# Patient Record
Sex: Female | Born: 1935 | Race: White | Hispanic: No | Smoking: Former smoker
Health system: Southern US, Community
[De-identification: ages and names within clinical notes are randomized; demographics above are authoritative.]

## PROBLEM LIST (undated history)

## (undated) DIAGNOSIS — K219 Gastro-esophageal reflux disease without esophagitis: Secondary | ICD-10-CM

## (undated) DIAGNOSIS — M1990 Unspecified osteoarthritis, unspecified site: Secondary | ICD-10-CM

## (undated) DIAGNOSIS — J189 Pneumonia, unspecified organism: Secondary | ICD-10-CM

## (undated) DIAGNOSIS — J439 Emphysema, unspecified: Secondary | ICD-10-CM

## (undated) DIAGNOSIS — J449 Chronic obstructive pulmonary disease, unspecified: Secondary | ICD-10-CM

## (undated) HISTORY — PX: BREAST SURGERY: SHX581

## (undated) HISTORY — PX: ABDOMINAL HYSTERECTOMY: SHX81

## (undated) HISTORY — PX: BACK SURGERY: SHX140

---

## 2002-06-10 ENCOUNTER — Encounter: Admission: RE | Admit: 2002-06-10 | Discharge: 2002-06-10 | Payer: Self-pay | Admitting: Family Medicine

## 2002-06-10 ENCOUNTER — Encounter: Payer: Self-pay | Admitting: Family Medicine

## 2009-04-05 ENCOUNTER — Inpatient Hospital Stay (HOSPITAL_COMMUNITY): Admission: EM | Admit: 2009-04-05 | Discharge: 2009-04-10 | Payer: Self-pay | Admitting: Emergency Medicine

## 2010-03-11 ENCOUNTER — Encounter: Payer: Self-pay | Admitting: Family Medicine

## 2010-05-10 LAB — COMPREHENSIVE METABOLIC PANEL
CO2: 35 mEq/L — ABNORMAL HIGH (ref 19–32)
Calcium: 8.6 mg/dL (ref 8.4–10.5)
Chloride: 93 mEq/L — ABNORMAL LOW (ref 96–112)
Creatinine, Ser: 0.78 mg/dL (ref 0.4–1.2)
GFR calc non Af Amer: >60 mL/min (ref >60)
Glucose, Bld: 146 mg/dL — ABNORMAL HIGH (ref 70–99)
Total Bilirubin: 0.9 mg/dL (ref 0.3–1.2)

## 2010-05-10 LAB — BASIC METABOLIC PANEL
BUN: 11 mg/dL (ref 6–23)
BUN: 19 mg/dL (ref 6–23)
BUN: 20 mg/dL (ref 6–23)
CO2: 36 mEq/L — ABNORMAL HIGH (ref 19–32)
CO2: 40 mEq/L — ABNORMAL HIGH (ref 19–32)
CO2: 44 mEq/L (ref 19–32)
Calcium: 9 mg/dL (ref 8.4–10.5)
Calcium: 9 mg/dL (ref 8.4–10.5)
Calcium: 9.1 mg/dL (ref 8.4–10.5)
Calcium: 9.2 mg/dL (ref 8.4–10.5)
Chloride: 90 mEq/L — ABNORMAL LOW (ref 96–112)
Chloride: 92 mEq/L — ABNORMAL LOW (ref 96–112)
Creatinine, Ser: 0.7 mg/dL (ref 0.4–1.2)
Creatinine, Ser: 0.73 mg/dL (ref 0.4–1.2)
Creatinine, Ser: 0.73 mg/dL (ref 0.4–1.2)
GFR calc Af Amer: >60 mL/min (ref >60)
GFR calc Af Amer: >60 mL/min (ref >60)
GFR calc Af Amer: >60 mL/min (ref >60)
GFR calc non Af Amer: >60 mL/min (ref >60)
GFR calc non Af Amer: >60 mL/min (ref >60)
Glucose, Bld: 124 mg/dL — ABNORMAL HIGH (ref 70–99)
Glucose, Bld: 140 mg/dL — ABNORMAL HIGH (ref 70–99)
Glucose, Bld: 96 mg/dL (ref 70–99)
Potassium: 3.9 mEq/L (ref 3.5–5.1)
Potassium: 4.1 mEq/L (ref 3.5–5.1)
Sodium: 140 mEq/L (ref 135–145)
Sodium: 141 mEq/L (ref 135–145)
Sodium: 141 mEq/L (ref 135–145)
Sodium: 141 mEq/L (ref 135–145)

## 2010-05-10 LAB — CBC
HCT: 33.1 % — ABNORMAL LOW (ref 36.0–46.0)
HCT: 33.5 % — ABNORMAL LOW (ref 36.0–46.0)
HCT: 35.6 % — ABNORMAL LOW (ref 36.0–46.0)
Hemoglobin: 11.1 g/dL — ABNORMAL LOW (ref 12.0–15.0)
Hemoglobin: 11.3 g/dL — ABNORMAL LOW (ref 12.0–15.0)
Hemoglobin: 11.7 g/dL — ABNORMAL LOW (ref 12.0–15.0)
Hemoglobin: 11.9 g/dL — ABNORMAL LOW (ref 12.0–15.0)
MCHC: 33.1 g/dL (ref 30.0–36.0)
MCHC: 33.5 g/dL (ref 30.0–36.0)
MCHC: 33.7 g/dL (ref 30.0–36.0)
MCHC: 33.8 g/dL (ref 30.0–36.0)
MCV: 89 fL (ref 78.0–100.0)
MCV: 89.2 fL (ref 78.0–100.0)
MCV: 89.7 fL (ref 78.0–100.0)
Platelets: 239 10*3/uL (ref 150–400)
RBC: 3.71 MIL/uL — ABNORMAL LOW (ref 3.87–5.11)
RBC: 3.76 MIL/uL — ABNORMAL LOW (ref 3.87–5.11)
RBC: 3.91 MIL/uL (ref 3.87–5.11)
RBC: 4.16 MIL/uL (ref 3.87–5.11)
RDW: 13.7 % (ref 11.5–15.5)
RDW: 14 % (ref 11.5–15.5)
RDW: 14 % (ref 11.5–15.5)
WBC: 12 10*3/uL — ABNORMAL HIGH (ref 4.0–10.5)
WBC: 9.9 10*3/uL (ref 4.0–10.5)

## 2010-05-10 LAB — DIFFERENTIAL
Basophils Absolute: 0 10*3/uL (ref 0.0–0.1)
Basophils Absolute: 0 10*3/uL (ref 0.0–0.1)
Basophils Relative: 0 % (ref 0–1)
Eosinophils Absolute: 0 10*3/uL (ref 0.0–0.7)
Eosinophils Relative: 0 % (ref 0–5)
Lymphocytes Relative: 3 % — ABNORMAL LOW (ref 12–46)
Lymphs Abs: 0.3 10*3/uL — ABNORMAL LOW (ref 0.7–4.0)
Monocytes Relative: 6 % (ref 3–12)
Neutro Abs: 10.4 10*3/uL — ABNORMAL HIGH (ref 1.7–7.7)
Neutrophils Relative %: 84 % — ABNORMAL HIGH (ref 43–77)
Neutrophils Relative %: 96 % — ABNORMAL HIGH (ref 43–77)

## 2010-05-10 LAB — BLOOD GAS, ARTERIAL
Acid-Base Excess: 15.2 mmol/L — ABNORMAL HIGH (ref 0.0–2.0)
Bicarbonate: 43.3 mEq/L — ABNORMAL HIGH (ref 20.0–24.0)
O2 Content: 2 L/min
O2 Saturation: 92.5 %
O2 Saturation: 95 %
Patient temperature: 98.6
TCO2: 39.8 mmol/L (ref 0–100)
pO2, Arterial: 65.4 mmHg — ABNORMAL LOW (ref 80.0–100.0)
pO2, Arterial: 77.5 mmHg — ABNORMAL LOW (ref 80.0–100.0)

## 2010-05-10 LAB — CULTURE, BLOOD (ROUTINE X 2): Culture: NO GROWTH

## 2011-09-03 ENCOUNTER — Encounter (HOSPITAL_COMMUNITY): Payer: Self-pay | Admitting: *Deleted

## 2011-09-03 ENCOUNTER — Emergency Department (HOSPITAL_COMMUNITY)
Admission: EM | Admit: 2011-09-03 | Discharge: 2011-09-03 | Disposition: A | Discharge status: Home / Self Care | Payer: Medicare Other | Attending: Emergency Medicine | Admitting: Emergency Medicine

## 2011-09-03 ENCOUNTER — Emergency Department (HOSPITAL_COMMUNITY): Payer: Medicare Other

## 2011-09-03 DIAGNOSIS — R059 Cough, unspecified: Secondary | ICD-10-CM | POA: Insufficient documentation

## 2011-09-03 DIAGNOSIS — Z79899 Other long term (current) drug therapy: Secondary | ICD-10-CM | POA: Insufficient documentation

## 2011-09-03 DIAGNOSIS — R0602 Shortness of breath: Secondary | ICD-10-CM | POA: Insufficient documentation

## 2011-09-03 DIAGNOSIS — R05 Cough: Secondary | ICD-10-CM | POA: Insufficient documentation

## 2011-09-03 DIAGNOSIS — R079 Chest pain, unspecified: Secondary | ICD-10-CM | POA: Insufficient documentation

## 2011-09-03 DIAGNOSIS — J441 Chronic obstructive pulmonary disease with (acute) exacerbation: Secondary | ICD-10-CM | POA: Insufficient documentation

## 2011-09-03 HISTORY — DX: Chronic obstructive pulmonary disease, unspecified: J44.9

## 2011-09-03 HISTORY — DX: Unspecified osteoarthritis, unspecified site: M19.90

## 2011-09-03 LAB — CBC WITH DIFFERENTIAL/PLATELET
Basophils Absolute: 0 10*3/uL (ref 0.0–0.1)
Basophils Relative: 0 % (ref 0–1)
Eosinophils Absolute: 0.1 10*3/uL (ref 0.0–0.7)
MCH: 27.5 pg (ref 26.0–34.0)
MCHC: 29.3 g/dL — ABNORMAL LOW (ref 30.0–36.0)
Neutro Abs: 5.6 10*3/uL (ref 1.7–7.7)
Neutrophils Relative %: 80 % — ABNORMAL HIGH (ref 43–77)
Platelets: 189 10*3/uL (ref 150–400)
RDW: 14.5 % (ref 11.5–15.5)

## 2011-09-03 LAB — BASIC METABOLIC PANEL
Chloride: 97 mEq/L (ref 96–112)
GFR calc Af Amer: >90 mL/min (ref >90)
GFR calc non Af Amer: 89 mL/min — ABNORMAL LOW (ref >90)
Potassium: 4.1 mEq/L (ref 3.5–5.1)

## 2011-09-03 MED ORDER — ALBUTEROL SULFATE (5 MG/ML) 0.5% IN NEBU
2.5000 mg | INHALATION_SOLUTION | Freq: Once | RESPIRATORY_TRACT | Status: AC
  Administered 2011-09-03: 2.5 mg via RESPIRATORY_TRACT
  Filled 2011-09-03: qty 0.5

## 2011-09-03 MED ORDER — PREDNISONE 20 MG PO TABS: 60.0000 mg | ORAL_TABLET | Freq: Every day | ORAL | Status: AC

## 2011-09-03 MED ORDER — IPRATROPIUM BROMIDE 0.02 % IN SOLN: 0.5000 mg | RESPIRATORY_TRACT | Status: DC

## 2011-09-03 MED ORDER — IPRATROPIUM BROMIDE 0.02 % IN SOLN
0.5000 mg | Freq: Once | RESPIRATORY_TRACT | Status: AC
  Administered 2011-09-03: 0.5 mg via RESPIRATORY_TRACT
  Filled 2011-09-03: qty 2.5

## 2011-09-03 MED ORDER — PREDNISONE 20 MG PO TABS
60.0000 mg | ORAL_TABLET | Freq: Once | ORAL | Status: AC
  Administered 2011-09-03: 60 mg via ORAL
  Filled 2011-09-03: qty 3

## 2011-09-03 MED ORDER — ALBUTEROL SULFATE (5 MG/ML) 0.5% IN NEBU: 2.5000 mg | INHALATION_SOLUTION | RESPIRATORY_TRACT | Status: DC

## 2011-09-03 MED ORDER — DOXYCYCLINE HYCLATE 100 MG PO CAPS: 100.0000 mg | ORAL_CAPSULE | Freq: Two times a day (BID) | ORAL | Status: AC

## 2011-09-03 MED ORDER — DOXYCYCLINE HYCLATE 100 MG PO TABS
100.0000 mg | ORAL_TABLET | Freq: Once | ORAL | Status: AC
  Administered 2011-09-03: 100 mg via ORAL
  Filled 2011-09-03: qty 1

## 2011-09-03 NOTE — ED Notes (Signed)
Per EMS- pt has had SOB for at least one week. Has become progressively worse. Cough with clear sputum. Pt has swollen ankles and "blue" hands that EMS states is normal for her. Pt reports congestion like when she has pneumonia.

## 2011-09-03 NOTE — ED Provider Notes (Signed)
I saw and evaluated the patient, reviewed the resident's note and I agree with the findings and plan.  Bakary Bramer, MD 09/03/11 1600 

## 2011-09-03 NOTE — ED Provider Notes (Signed)
History     CSN: [id number]  Arrival date & time 09/03/11  1224   First MD Initiated Contact with Patient 09/03/11 1227      Chief Complaint  Patient presents with  . Shortness of Breath    (Consider location/radiation/quality/duration/timing/severity/associated sxs/prior treatment) Patient is a 78 y.o. female presenting with shortness of breath. The history is provided by the patient and a relative.  Shortness of Breath  The current episode started 5 to 7 days ago. The onset was gradual. The problem occurs continuously. The problem has been gradually worsening. The problem is moderate. The symptoms are relieved by beta-agonist inhalers and rest. The symptoms are aggravated by activity. Associated symptoms include chest pain, cough and shortness of breath. Pertinent negatives include no chest pressure, no orthopnea, no fever, no rhinorrhea, no sore throat, no stridor and no wheezing. There was no intake of a foreign body. She has had intermittent steroid use. She has had prior hospitalizations. Her past medical history is significant for past wheezing. Past medical history comments: copd. She has been behaving normally. Urine output has been normal. There were no sick contacts. She has received no recent medical care.    Past Medical History  Diagnosis Date  . COPD (chronic obstructive pulmonary disease)   . Arthritis     Past Surgical History  Procedure Date  . Abdominal hysterectomy   . Breast surgery     left  . Back surgery     History reviewed. No pertinent family history.  History  Substance Use Topics  . Smoking status: Former Games developer  . Smokeless tobacco: Not on file  . Alcohol Use: No    OB History    Grav Para Term Preterm Abortions TAB SAB Ect Mult Living                  Review of Systems  Constitutional: Negative for fever, chills, diaphoresis and fatigue.  HENT: Negative for ear pain, congestion, sore throat, facial swelling, rhinorrhea, mouth sores,  trouble swallowing, neck pain and neck stiffness.   Eyes: Negative.   Respiratory: Positive for cough and shortness of breath. Negative for apnea, chest tightness, wheezing and stridor.   Cardiovascular: Positive for chest pain. Negative for palpitations, orthopnea and leg swelling.  Gastrointestinal: Negative for nausea, vomiting, abdominal pain, diarrhea and abdominal distention.  Genitourinary: Negative for hematuria, flank pain, vaginal discharge, difficulty urinating and menstrual problem.  Musculoskeletal: Negative for back pain and gait problem.  Skin: Negative for rash and wound.  Neurological: Negative for dizziness, tremors, seizures, syncope, facial asymmetry, numbness and headaches.  Psychiatric/Behavioral: Negative.   All other systems reviewed and are negative.    Allergies  Codeine  Home Medications   Current Outpatient Rx  Name Route Sig Dispense Refill  . ALBUTEROL SULFATE HFA 108 (90 BASE) MCG/ACT IN AERS Inhalation Inhale 2 puffs into the lungs every 6 (six) hours as needed.    . ALBUTEROL SULFATE (2.5 MG/3ML) 0.083% IN NEBU Nebulization Take 2.5 mg by nebulization every 6 (six) hours as needed. For shortness of breath    . ALPRAZOLAM 1 MG PO TABS Oral Take 1 mg by mouth 3 (three) times daily as needed.    . ASPIRIN 81 MG PO TABS Oral Take 81 mg by mouth daily.    Marland Kitchen BUTALBITAL-APAP-CAFFEINE 50-325-40 MG PO TABS Oral Take 1 tablet by mouth 2 (two) times daily as needed.    . FUROSEMIDE 20 MG PO TABS Oral Take 20 mg by mouth daily.    Marland Kitchen  ADULT MULTIVITAMIN W/MINERALS CH Oral Take 1 tablet by mouth daily.    Marland Kitchen POTASSIUM CHLORIDE CRYS ER 10 MEQ PO TBCR Oral Take 10 mEq by mouth daily.    Marland Kitchen PREDNISONE 1 MG PO TABS Oral Take 7 mg by mouth daily.    . STOOL SOFTENER & LAXATIVE PO Oral Take 1 capsule by mouth daily.      BP 114/63  Pulse 92  Temp 99 F (37.2 C) (Oral)  Resp 20  SpO2 98%  Physical Exam  Nursing note and vitals reviewed. Constitutional: She is  oriented to person, place, and time. She appears well-developed. No distress.  HENT:  Head: Normocephalic and atraumatic.  Right Ear: External ear normal.  Left Ear: External ear normal.  Nose: Nose normal.  Mouth/Throat: Oropharynx is clear and moist. No oropharyngeal exudate.  Eyes: Conjunctivae and EOM are normal. Pupils are equal, round, and reactive to light. Right eye exhibits no discharge. Left eye exhibits no discharge.  Neck: Normal range of motion. Neck supple. No JVD present. No tracheal deviation present. No thyromegaly present.  Cardiovascular: Normal rate, regular rhythm, normal heart sounds and intact distal pulses.  Exam reveals no gallop and no friction rub.   No murmur heard. Pulmonary/Chest: Tachypnea noted. No respiratory distress. She has decreased breath sounds (diffusely throughout). She has no wheezes. She has no rales. She exhibits no tenderness.  Abdominal: Soft. Bowel sounds are normal. She exhibits no distension. There is no tenderness. There is no rebound and no guarding.  Musculoskeletal: Normal range of motion.  Lymphadenopathy:    She has no cervical adenopathy.  Neurological: She is alert and oriented to person, place, and time. No cranial nerve deficit. Coordination normal.  Skin: Skin is warm. No rash noted. She is not diaphoretic.  Psychiatric: She has a normal mood and affect. Her behavior is normal. Judgment and thought content normal.    ED Course  Procedures (including critical care time)   Labs Reviewed  CBC WITH DIFFERENTIAL  BASIC METABOLIC PANEL   No results found.   No diagnosis found.    MDM  78 year old female patient with past medical history of COPD who currently takes albuterol and is on 4 L of oxygen at home for her disease presents with worsening shortness of breath and cough for one week. Symptoms progressively worsening. Patient says cough is productive of yellowish sputum and increase in quantity. Patient says that she  started developing chest discomfort with coughing and breathing and this is the chest atypical for ACS. Patient with decreased bilateral breath sounds. Patient says her albuterol at home does help but is not improving as much as she would like. Differential diagnosis includes COPD exacerbation versus possible pneumonia.  Results for orders placed during the hospital encounter of 09/03/11  CBC WITH DIFFERENTIAL      Component Value Range   WBC 7.1  4.0 - 10.5 K/uL   RBC 3.86 (*) 3.87 - 5.11 MIL/uL   Hemoglobin 10.6 (*) 12.0 - 15.0 g/dL   HCT 16.1  09.6 - 04.5 %   MCV 93.8  78.0 - 100.0 fL   MCH 27.5  26.0 - 34.0 pg   MCHC 29.3 (*) 30.0 - 36.0 g/dL   RDW 40.9  81.1 - 91.4 %   Platelets 189  150 - 400 K/uL   Neutrophils Relative 80 (*) 43 - 77 %   Neutro Abs 5.6  1.7 - 7.7 K/uL   Lymphocytes Relative 13  12 - 46 %  Lymphs Abs 0.9  0.7 - 4.0 K/uL   Monocytes Relative 6  3 - 12 %   Monocytes Absolute 0.4  0.1 - 1.0 K/uL   Eosinophils Relative 2  0 - 5 %   Eosinophils Absolute 0.1  0.0 - 0.7 K/uL   Basophils Relative 0  0 - 1 %   Basophils Absolute 0.0  0.0 - 0.1 K/uL  BASIC METABOLIC PANEL      Component Value Range   Sodium 141  135 - 145 mEq/L   Potassium 4.1  3.5 - 5.1 mEq/L   Chloride 97  96 - 112 mEq/L   CO2 38 (*) 19 - 32 mEq/L   Glucose, Bld 94  70 - 99 mg/dL   BUN 19  6 - 23 mg/dL   Creatinine, Ser 1.61  0.50 - 1.10 mg/dL   Calcium 9.3  8.4 - 09.6 mg/dL   GFR calc non Af Amer 89 (*) >90 mL/min   GFR calc Af Amer >90  >90 mL/min   DG Chest 2 View (Final result)   Result time:09/03/11 1414    Final result by Rad Results In Interface (09/03/11 14:14:06)    Narrative:   *RADIOLOGY REPORT*  Clinical Data: Shortness of breath, wheezing.  CHEST - 2 VIEW  Comparison: 12/31/2010  Findings: There is hyperinflation of the lungs compatible with COPD. Mild cardiomegaly. Chronic interstitial lung disease throughout the lungs. Small effusion noted on the lateral view, likely  on the left. Chronic changes in the apices, likely scarring.  IMPRESSION: COPD/chronic changes. Cardiomegaly. The small left effusion on the lateral view, likely on the left.  Original Report Authenticated By: Cyndie Chime, M.D.    Date: 09/03/2011  Rate: 91  Rhythm: normal sinus rhythm  QRS Axis: normal  Intervals: normal  ST/T Wave abnormalities: normal  Conduction Disutrbances: none  Narrative Interpretation:   Old EKG Reviewed: No significant changes noted   Patient's symptoms improved with DuoNeb treatment. No evidence of pneumonia on chest x-ray. Likely patient with COPD exacerbation. Patient satting 99% on 4 L which is what she wears at home. Patient able to speak in full sentences does not appear short of breath or tachypnea and feels that her shortness of breath is improved. Will treat patient with 7 days of doxycycline and a 5 day steroid boost. Outpatient followup with PCP and instructed her to return if her symptoms get worse.  Case discussed with Dr. Allison Quarry, MD 09/03/11 1556

## 2011-09-03 NOTE — ED Notes (Signed)
Pt states that the breathing treatment "helped some" but states that she still feels pressure in her chest.

## 2011-09-03 NOTE — ED Provider Notes (Signed)
I saw and evaluated the patient, reviewed the resident's note and I agree with the findings and plan. Pt has oxygen dependent copd.  Has had progressive sob, cough for a few days along with cp.  On exam tachypneic with decreased bs.  cxr no pneumonia.  Saturations nl on oxygen. Will release on abxs since + change in sputum.    Cheri Guppy, MD 09/03/11 [phone]

## 2011-09-03 NOTE — ED Notes (Signed)
Pt called RN into room to show a small amount of bright red blood on tissue that pt "coughed up". Pt also states that she forgot to tell MD that she has had left sided chest pain intermittently for 2 weeks. The last occurrence was yesterday. Pt describes pain as burning under left breast. MD notified.

## 2011-12-20 ENCOUNTER — Encounter (HOSPITAL_COMMUNITY): Payer: Self-pay | Admitting: *Deleted

## 2011-12-20 ENCOUNTER — Emergency Department (HOSPITAL_COMMUNITY)
Admission: EM | Admit: 2011-12-20 | Discharge: 2011-12-20 | Disposition: A | Discharge status: Home / Self Care | Payer: Medicare Other | Attending: Emergency Medicine | Admitting: Emergency Medicine

## 2011-12-20 DIAGNOSIS — M129 Arthropathy, unspecified: Secondary | ICD-10-CM | POA: Insufficient documentation

## 2011-12-20 DIAGNOSIS — J449 Chronic obstructive pulmonary disease, unspecified: Secondary | ICD-10-CM | POA: Insufficient documentation

## 2011-12-20 DIAGNOSIS — R040 Epistaxis: Secondary | ICD-10-CM

## 2011-12-20 DIAGNOSIS — J438 Other emphysema: Secondary | ICD-10-CM | POA: Insufficient documentation

## 2011-12-20 DIAGNOSIS — Z7982 Long term (current) use of aspirin: Secondary | ICD-10-CM | POA: Insufficient documentation

## 2011-12-20 DIAGNOSIS — J4489 Other specified chronic obstructive pulmonary disease: Secondary | ICD-10-CM | POA: Insufficient documentation

## 2011-12-20 DIAGNOSIS — Z87891 Personal history of nicotine dependence: Secondary | ICD-10-CM | POA: Insufficient documentation

## 2011-12-20 HISTORY — DX: Emphysema, unspecified: J43.9

## 2011-12-20 LAB — CBC WITH DIFFERENTIAL/PLATELET
Basophils Relative: 1 % (ref 0–1)
HCT: 33.5 % — ABNORMAL LOW (ref 36.0–46.0)
Hemoglobin: 10 g/dL — ABNORMAL LOW (ref 12.0–15.0)
Lymphs Abs: 1.5 10*3/uL (ref 0.7–4.0)
MCH: 27.6 pg (ref 26.0–34.0)
MCHC: 29.9 g/dL — ABNORMAL LOW (ref 30.0–36.0)
Monocytes Absolute: 0.6 10*3/uL (ref 0.1–1.0)
Monocytes Relative: 7 % (ref 3–12)
Neutro Abs: 6.1 10*3/uL (ref 1.7–7.7)

## 2011-12-20 MED ORDER — CEPHALEXIN 500 MG PO CAPS: 500.0000 mg | ORAL_CAPSULE | Freq: Two times a day (BID) | ORAL | Status: DC

## 2011-12-20 MED ORDER — TRAMADOL HCL 50 MG PO TABS: 50.0000 mg | ORAL_TABLET | Freq: Four times a day (QID) | ORAL | Status: DC | PRN

## 2011-12-20 NOTE — ED Notes (Signed)
Pt with nosebleed. Pt states it started yesterday around 1800. Nosebleed has not ceased.

## 2011-12-20 NOTE — ED Notes (Signed)
Hyacinth Meeker EDP gave this nurse verbal order to discharge pt when her CBC is resulted.

## 2011-12-20 NOTE — ED Provider Notes (Signed)
History     CSN: [id number]  Arrival date & time 12/20/11  2725   First MD Initiated Contact with Patient 12/20/11 [phone]      Chief Complaint  Patient presents with  . Epistaxis    (Consider location/radiation/quality/duration/timing/severity/associated sxs/prior treatment) HPI Comments: 78 year old female who takes aspirin and also has COPD but has a complaint of a nosebleed it started yesterday evening, has been persistent throughout the night, mild, persistent, nothing makes better or worse, the nosebleed persisted despite holding pressure over the anterior nostrils. She admits to having a mild amount of blood was dripping down her throat as well. She denies any fevers shortness of breath or cough. She admits to having chronic home oxygen secondary to her obstructive pulmonary disease as well as being a nose picker.  Patient is a 78 y.o. female presenting with nosebleeds. The history is provided by the patient and a relative.  Epistaxis     Past Medical History  Diagnosis Date  . COPD (chronic obstructive pulmonary disease)   . Arthritis   . Emphysema     Past Surgical History  Procedure Date  . Abdominal hysterectomy   . Breast surgery     left  . Back surgery     History reviewed. No pertinent family history.  History  Substance Use Topics  . Smoking status: Former Games developer  . Smokeless tobacco: Never Used  . Alcohol Use: No    OB History    Grav Para Term Preterm Abortions TAB SAB Ect Mult Living                  Review of Systems  HENT: Positive for nosebleeds.   Respiratory: Negative for cough.     Allergies  Codeine  Home Medications   Current Outpatient Rx  Name Route Sig Dispense Refill  . ALBUTEROL SULFATE HFA 108 (90 BASE) MCG/ACT IN AERS Inhalation Inhale 2 puffs into the lungs every 6 (six) hours as needed.    . ALBUTEROL SULFATE (2.5 MG/3ML) 0.083% IN NEBU Nebulization Take 2.5 mg by nebulization every 6 (six) hours as needed. For  shortness of breath    . ALPRAZOLAM 1 MG PO TABS Oral Take 1 mg by mouth 3 (three) times daily as needed.    . ASPIRIN 81 MG PO TABS Oral Take 81 mg by mouth daily.    Marland Kitchen BUTALBITAL-APAP-CAFFEINE 50-325-40 MG PO TABS Oral Take 1 tablet by mouth 2 (two) times daily as needed.    . CEPHALEXIN 500 MG PO CAPS Oral Take 1 capsule (500 mg total) by mouth 2 (two) times daily. 20 capsule 0  . FUROSEMIDE 20 MG PO TABS Oral Take 20 mg by mouth daily.    . ADULT MULTIVITAMIN W/MINERALS CH Oral Take 1 tablet by mouth daily.    Marland Kitchen POTASSIUM CHLORIDE CRYS ER 10 MEQ PO TBCR Oral Take 10 mEq by mouth daily.    Marland Kitchen PREDNISONE 1 MG PO TABS Oral Take 7 mg by mouth daily.    . STOOL SOFTENER & LAXATIVE PO Oral Take 1 capsule by mouth daily.    . TRAMADOL HCL 50 MG PO TABS Oral Take 1 tablet (50 mg total) by mouth every 6 (six) hours as needed for pain. 15 tablet 0    BP 148/70  Pulse 93  Temp 97.5 F (36.4 C) (Oral)  Resp 20  SpO2 100%  Physical Exam  Nursing note and vitals reviewed. Constitutional: She appears well-developed and well-nourished. No distress.  HENT:  Head: Normocephalic and atraumatic.       Small amount of blood in the posterior oropharynx, blood clot from the nasopharynx present, mucous membranes are moist. Turbinates appear normal on the left, there is a large septal defect between right and left nares, right knee are occluded with blood clot, no active bleeding.  Eyes: Conjunctivae normal are normal. No scleral icterus.  Pulmonary/Chest: Effort normal. She has wheezes (diffuse expiratory wheezing is mild).  Abdominal: Soft. There is no tenderness.  Musculoskeletal:       No peripheral edema  Neurological:       Awake, alert, follows commands, speech is clear  Skin: Skin is warm and dry. No rash noted. She is not diaphoretic.    ED Course  EPISTAXIS MANAGEMENT Date/Time: 12/20/2011 6:45 AM Performed by: Eber Hong D Authorized by: Eber Hong D Consent: Verbal consent  obtained. Risks and benefits: risks, benefits and alternatives were discussed Consent given by: patient Patient understanding: patient states understanding of the procedure being performed Patient consent: the patient's understanding of the procedure matches consent given Required items: required blood products, implants, devices, and special equipment available Patient identity confirmed: verbally with patient Time out: Immediately prior to procedure a "time out" was called to verify the correct patient, procedure, equipment, support staff and site/side marked as required. Patient sedated: no Treatment site: right anterior Repair method: suction and anterior pack Post-procedure assessment: bleeding stopped Treatment complexity: simple Patient tolerance: Patient tolerated the procedure well with no immediate complications.   (including critical care time)   Labs Reviewed  CBC WITH DIFFERENTIAL   No results found.   1. Epistaxis       MDM  The patient has chronic COPD, she is oxygen dependent and in fact on my exam when the oxygen is taken off she drops to below 90%. She feels much more comfortable and low flow oxygen. She has been stable, has had a small amount of anterior packing, CBC ordered, abx, pain meds, ENT f/u.  Bleeding has resolved, CBC drawn, patient has expressed her understanding,   Discharge Prescriptions include:   Ultram  Keflex       Vida Roller, MD 12/20/11 [phone]

## 2012-01-02 ENCOUNTER — Emergency Department (HOSPITAL_COMMUNITY): Payer: Medicare Other

## 2012-01-02 ENCOUNTER — Encounter (HOSPITAL_COMMUNITY): Payer: Self-pay | Admitting: Emergency Medicine

## 2012-01-02 ENCOUNTER — Observation Stay (HOSPITAL_COMMUNITY)
Admission: EM | Admit: 2012-01-02 | Discharge: 2012-01-04 | Disposition: A | Discharge status: Home / Self Care | Payer: Medicare Other | Attending: Internal Medicine | Admitting: Internal Medicine

## 2012-01-02 DIAGNOSIS — I878 Other specified disorders of veins: Secondary | ICD-10-CM

## 2012-01-02 DIAGNOSIS — D638 Anemia in other chronic diseases classified elsewhere: Secondary | ICD-10-CM | POA: Insufficient documentation

## 2012-01-02 DIAGNOSIS — J441 Chronic obstructive pulmonary disease with (acute) exacerbation: Secondary | ICD-10-CM

## 2012-01-02 DIAGNOSIS — Z7982 Long term (current) use of aspirin: Secondary | ICD-10-CM | POA: Insufficient documentation

## 2012-01-02 DIAGNOSIS — J209 Acute bronchitis, unspecified: Principal | ICD-10-CM

## 2012-01-02 DIAGNOSIS — J440 Chronic obstructive pulmonary disease with acute lower respiratory infection: Principal | ICD-10-CM | POA: Insufficient documentation

## 2012-01-02 DIAGNOSIS — R040 Epistaxis: Secondary | ICD-10-CM

## 2012-01-02 DIAGNOSIS — Z79899 Other long term (current) drug therapy: Secondary | ICD-10-CM | POA: Insufficient documentation

## 2012-01-02 DIAGNOSIS — I872 Venous insufficiency (chronic) (peripheral): Secondary | ICD-10-CM | POA: Insufficient documentation

## 2012-01-02 DIAGNOSIS — Z23 Encounter for immunization: Secondary | ICD-10-CM | POA: Insufficient documentation

## 2012-01-02 DIAGNOSIS — Z87891 Personal history of nicotine dependence: Secondary | ICD-10-CM | POA: Insufficient documentation

## 2012-01-02 LAB — COMPREHENSIVE METABOLIC PANEL
Albumin: 3.2 g/dL — ABNORMAL LOW (ref 3.5–5.2)
BUN: 20 mg/dL (ref 6–23)
Calcium: 9.1 mg/dL (ref 8.4–10.5)
Creatinine, Ser: 0.56 mg/dL (ref 0.50–1.10)
GFR calc Af Amer: >90 mL/min (ref >90)
Glucose, Bld: 163 mg/dL — ABNORMAL HIGH (ref 70–99)
Potassium: 4.2 mEq/L (ref 3.5–5.1)
Total Protein: 6.8 g/dL (ref 6.0–8.3)

## 2012-01-02 LAB — CBC WITH DIFFERENTIAL/PLATELET
Basophils Relative: 0 % (ref 0–1)
Eosinophils Absolute: 0 10*3/uL (ref 0.0–0.7)
Eosinophils Relative: 0 % (ref 0–5)
Hemoglobin: 9.4 g/dL — ABNORMAL LOW (ref 12.0–15.0)
MCH: 27 pg (ref 26.0–34.0)
MCHC: 28 g/dL — ABNORMAL LOW (ref 30.0–36.0)
MCV: 96.6 fL (ref 78.0–100.0)
Monocytes Absolute: 0.4 10*3/uL (ref 0.1–1.0)
Monocytes Relative: 5 % (ref 3–12)
Neutrophils Relative %: 83 % — ABNORMAL HIGH (ref 43–77)

## 2012-01-02 LAB — LACTIC ACID, PLASMA: Lactic Acid, Venous: 1.7 mmol/L (ref 0.5–2.2)

## 2012-01-02 MED ORDER — ALBUTEROL SULFATE (5 MG/ML) 0.5% IN NEBU
2.5000 mg | INHALATION_SOLUTION | Freq: Four times a day (QID) | RESPIRATORY_TRACT | Status: DC
  Administered 2012-01-02 – 2012-01-04 (×7): 2.5 mg via RESPIRATORY_TRACT
  Filled 2012-01-02 (×8): qty 0.5

## 2012-01-02 MED ORDER — ASPIRIN EC 81 MG PO TBEC
81.0000 mg | DELAYED_RELEASE_TABLET | Freq: Every day | ORAL | Status: DC
  Administered 2012-01-03 – 2012-01-04 (×2): 81 mg via ORAL
  Filled 2012-01-02 (×2): qty 1

## 2012-01-02 MED ORDER — ALBUTEROL SULFATE (5 MG/ML) 0.5% IN NEBU
2.5000 mg | INHALATION_SOLUTION | RESPIRATORY_TRACT | Status: DC | PRN
  Administered 2012-01-03: 2.5 mg via RESPIRATORY_TRACT
  Filled 2012-01-02: qty 0.5

## 2012-01-02 MED ORDER — POTASSIUM CHLORIDE CRYS ER 10 MEQ PO TBCR
10.0000 meq | EXTENDED_RELEASE_TABLET | Freq: Every day | ORAL | Status: DC
  Administered 2012-01-02 – 2012-01-04 (×3): 10 meq via ORAL
  Filled 2012-01-02 (×3): qty 1

## 2012-01-02 MED ORDER — METHYLPREDNISOLONE SODIUM SUCC 125 MG IJ SOLR
125.0000 mg | Freq: Once | INTRAMUSCULAR | Status: AC
  Administered 2012-01-02: 125 mg via INTRAVENOUS
  Filled 2012-01-02: qty 2

## 2012-01-02 MED ORDER — ALPRAZOLAM 1 MG PO TABS
1.0000 mg | ORAL_TABLET | Freq: Three times a day (TID) | ORAL | Status: DC | PRN
  Administered 2012-01-02 – 2012-01-04 (×5): 1 mg via ORAL
  Filled 2012-01-02 (×5): qty 1

## 2012-01-02 MED ORDER — FUROSEMIDE 20 MG PO TABS
20.0000 mg | ORAL_TABLET | Freq: Every day | ORAL | Status: DC
  Administered 2012-01-03 – 2012-01-04 (×2): 20 mg via ORAL
  Filled 2012-01-02 (×2): qty 1

## 2012-01-02 MED ORDER — IPRATROPIUM BROMIDE 0.02 % IN SOLN
0.5000 mg | RESPIRATORY_TRACT | Status: AC
  Administered 2012-01-02 (×3): 0.5 mg via RESPIRATORY_TRACT
  Filled 2012-01-02 (×2): qty 2.5

## 2012-01-02 MED ORDER — LEVOFLOXACIN IN D5W 500 MG/100ML IV SOLN
500.0000 mg | INTRAVENOUS | Status: DC
  Administered 2012-01-02 – 2012-01-03 (×2): 500 mg via INTRAVENOUS
  Filled 2012-01-02 (×3): qty 100

## 2012-01-02 MED ORDER — ALBUTEROL SULFATE (5 MG/ML) 0.5% IN NEBU
5.0000 mg | INHALATION_SOLUTION | RESPIRATORY_TRACT | Status: AC
  Administered 2012-01-02 (×3): 5 mg via RESPIRATORY_TRACT
  Filled 2012-01-02: qty 0.5
  Filled 2012-01-02: qty 1

## 2012-01-02 MED ORDER — METHYLPREDNISOLONE SODIUM SUCC 40 MG IJ SOLR
40.0000 mg | Freq: Four times a day (QID) | INTRAMUSCULAR | Status: DC
  Administered 2012-01-02 – 2012-01-03 (×4): 40 mg via INTRAVENOUS
  Filled 2012-01-02 (×7): qty 1

## 2012-01-02 MED ORDER — BUTALBITAL-APAP-CAFFEINE 50-325-40 MG PO TABS
1.0000 | ORAL_TABLET | Freq: Two times a day (BID) | ORAL | Status: DC | PRN
  Administered 2012-01-02 – 2012-01-03 (×2): 1 via ORAL
  Filled 2012-01-02 (×2): qty 1

## 2012-01-02 MED ORDER — SODIUM CHLORIDE 0.9 % IJ SOLN
3.0000 mL | Freq: Two times a day (BID) | INTRAMUSCULAR | Status: DC
  Administered 2012-01-04: 3 mL via INTRAVENOUS

## 2012-01-02 MED ORDER — IPRATROPIUM BROMIDE 0.02 % IN SOLN
0.5000 mg | Freq: Four times a day (QID) | RESPIRATORY_TRACT | Status: DC
  Administered 2012-01-02 – 2012-01-03 (×3): 0.5 mg via RESPIRATORY_TRACT
  Filled 2012-01-02 (×3): qty 2.5

## 2012-01-02 MED ORDER — PNEUMOCOCCAL VAC POLYVALENT 25 MCG/0.5ML IJ INJ
0.5000 mL | INJECTION | INTRAMUSCULAR | Status: AC
  Administered 2012-01-03: 0.5 mL via INTRAMUSCULAR
  Filled 2012-01-02 (×2): qty 0.5

## 2012-01-02 NOTE — ED Notes (Signed)
Pt c/o of SOB that has gotten worse since last admit about a week ago. Now to the point where she can't walk from being tired and weak. Has a cough that is non productive. EMS placed on 5 liters of O2 and sats stayed at 91%

## 2012-01-02 NOTE — H&P (Signed)
[facility] History and Physical  Dana Hess:[id number] DOB: 1933-04-01 DOA: 01/02/2012  Referring physician: ED PCP: [name], MD  Specialists: None  Chief Complaint: SOB  HPI: Dana Hess is a 78 y.o. female h/o severe COPD on 4L home O2 at baseline for past 14 years presents to ED with SOB, cough productive of yellow sputum.  Worsening pulm status onset over past couple of days, daughter initially worried that this is PNA as she just had hospitalization for this recently.  Had to increase O2 at home to 5L which helped some.  No fevers, using nebs more frequently.  SOB located in lungs.  Quality is wheezing.  Evaluation in the ED demonstrated no PNA on CXR but severe emphysema.  Her wheezing was improved in ED with steroids and respiratory treatment.  Hospitalist has been asked to admit patient for COPD exacerbation, acute on chronic bronchitis.  Review of Systems: 12 systems reviewed and negative.  Past Medical History  Diagnosis Date  . COPD (chronic obstructive pulmonary disease)   . Arthritis   . Emphysema    Past Surgical History  Procedure Date  . Abdominal hysterectomy   . Breast surgery     left  . Back surgery    Social History:  reports that she has quit smoking. She has never used smokeless tobacco. She reports that she does not drink alcohol or use illicit drugs.   Allergies  Allergen Reactions  . Codeine Nausea And Vomiting and Rash    No family history on file. Daughter is alive and healthy.  Prior to Admission medications   Medication Sig Start Date End Date Taking? Authorizing Provider  albuterol (PROVENTIL HFA;VENTOLIN HFA) 108 (90 BASE) MCG/ACT inhaler Inhale 2 puffs into the lungs every 6 (six) hours as needed. For shortness of breath.   Yes Historical Provider, MD  albuterol (PROVENTIL) (2.5 MG/3ML) 0.083% nebulizer solution Take 2.5 mg by nebulization every 6 (six) hours as needed. For shortness of breath   Yes Historical  Provider, MD  ALPRAZolam Prudy Feeler) 1 MG tablet Take 1 mg by mouth 3 (three) times daily as needed. For anxiety.   Yes Historical Provider, MD  aspirin EC 81 MG tablet Take 81 mg by mouth daily.   Yes Historical Provider, MD  butalbital-acetaminophen-caffeine (FIORICET, ESGIC) 50-325-40 MG per tablet Take 1 tablet by mouth 2 (two) times daily as needed. For headache.   Yes Historical Provider, MD  furosemide (LASIX) 20 MG tablet Take 20 mg by mouth daily.   Yes Historical Provider, MD  potassium chloride (K-DUR,KLOR-CON) 10 MEQ tablet Take 10 mEq by mouth daily.   Yes Historical Provider, MD  predniSONE (DELTASONE) 1 MG tablet Take 7 mg by mouth daily.   Yes Historical Provider, MD  Sennosides-Docusate Sodium (STOOL SOFTENER & LAXATIVE PO) Take 1 capsule by mouth daily.   Yes Historical Provider, MD  cephALEXin (KEFLEX) 500 MG capsule Take 1 capsule (500 mg total) by mouth 2 (two) times daily. 12/20/11   Vida Roller, MD   Physical Exam: Filed Vitals:   01/02/12 1809 01/02/12 1829 01/02/12 1930 01/02/12 2049  BP:  140/70 116/61 129/55  Pulse:  118  103  Temp:  98 F (36.7 C)    TempSrc:  Oral    Resp:  12 22 28   SpO2: 92%   97%    General:  NAD, resting comfortably in bed Eyes: PEERLA EOMI ENT: mucous membranes moist Neck: supple w/o JVD Cardiovascular: RRR w/o MRG Respiratory: CTA  B at the time of my exam, unable to auscultate any wheezes which presumably have improved after ED intervention. Abdomen: soft, nt, nd, bs+ Skin: no rash nor lesion Musculoskeletal: MAE, full ROM all 4 extremities Psychiatric: normal tone and affect Neurologic: AAOx3, grossly non-focal  Labs on Admission:  Basic Metabolic Panel:  Lab 01/02/12 8119  NA 141  K 4.2  CL 94*  CO2 37*  GLUCOSE 163*  BUN 20  CREATININE 0.56  CALCIUM 9.1  MG --  PHOS --   Liver Function Tests:  Lab 01/02/12 1910  AST 19  ALT 12  ALKPHOS 63  BILITOT 0.1*  PROT 6.8  ALBUMIN 3.2*   No results found for this  basename: LIPASE:5,AMYLASE:5 in the last 168 hours No results found for this basename: AMMONIA:5 in the last 168 hours CBC:  Lab 01/02/12 1910  WBC 7.4  NEUTROABS 6.1  HGB 9.4*  HCT 33.6*  MCV 96.6  PLT 202   Cardiac Enzymes: No results found for this basename: CKTOTAL:5,CKMB:5,CKMBINDEX:5,TROPONINI:5 in the last 168 hours  BNP (last 3 results) No results found for this basename: PROBNP:3 in the last 8760 hours CBG: No results found for this basename: GLUCAP:5 in the last 168 hours  Radiological Exams on Admission: Dg Chest 2 View  01/02/2012  *RADIOLOGY REPORT*  Clinical Data: Shortness of breath.  CHEST - 2 VIEW  Comparison: 09/03/2011  Findings: There is chronic cardiomegaly with chronic or recurrent pulmonary vascular congestion superimposed on severe emphysema.  No acute infiltrates or effusions.  Chronic accentuation of the thoracic kyphosis.  IMPRESSION:  1.  Severe emphysema. 2.  Chronic cardiomegaly with recurrent or persistent pulmonary vascular congestion.   Original Report Authenticated By: Francene Boyers, M.D.     EKG: Independently reviewed.  Assessment/Plan Principal Problem:  *COPD with exacerbation Active Problems:  Acute bronchitis with chronic obstructive pulmonary disease (COPD)  Venous stasis of lower extremity   1. COPD with exacerbation - Admitting to observation, continuing IV solumedrol started in ED but at 40mg  q6h.  Also putting patient on GOLD protocol, adding levaquin for exacerbation.  Breathing treatments, continuous pulse ox.  No evidence of PNA on CXR, no evidence of a previously suspected mass seen on CXR some years ago (patient wouldn't want anything done about it anyhow she and daughter state). 2. DVT prophylaxis - Episode of severe epistaxis last week and mild episode yesterday - because of the bleeding issues patient wishes not to be put on prophylactic heparin for DVT prevention, cannot use SCDs on this patient due to venous stasis skin  changes, very thin skin as well over BLE explained to patient daughter risk of DVT and they understand.  No consults called at this time.  Code Status: Full Code (must indicate code status--if unknown or must be presumed, indicate so) Family Communication: Spoke with daughter at bedside (indicate person spoken with, if applicable, with phone number if by telephone) Disposition Plan: Admit to obs (indicate anticipated LOS)  Time spent:  GARDNER, JARED M. [facility] Pager [phone]  If 7PM-7AM, please contact night-coverage [email or web address] Password [facility] 01/02/2012, 9:17 PM

## 2012-01-02 NOTE — ED Notes (Signed)
WGN:[id number]<HY> Expected date:01/02/12<BR> Expected time: 5:55 PM<BR> Means of arrival:Ambulance<BR> Comments:<BR> Elderly/SOB/hx ofCOPD

## 2012-01-02 NOTE — ED Provider Notes (Signed)
History     CSN: [id number]  Arrival date & time 01/02/12  1758   First MD Initiated Contact with Patient 01/02/12 1809      Chief Complaint  Patient presents with  . Shortness of Breath    (Consider location/radiation/quality/duration/timing/severity/associated sxs/prior treatment) The history is provided by the patient.  Dana Hess is a 78 y.o. female hx of COPD here with SOB, cough. Chronic cough that is getting worse. For the last few days, she is coughing but unable to get phlegm out. No fevers. Her daughter just got out of the hospital for pneumonia and thinks that mom has pneumonia. She is on 5L nasal cannula at home and has been using more of her albuterol nebs. She is on prednisone 7.5 mg daily.    Past Medical History  Diagnosis Date  . COPD (chronic obstructive pulmonary disease)   . Arthritis   . Emphysema     Past Surgical History  Procedure Date  . Abdominal hysterectomy   . Breast surgery     left  . Back surgery     No family history on file.  History  Substance Use Topics  . Smoking status: Former Games developer  . Smokeless tobacco: Never Used  . Alcohol Use: No    OB History    Grav Para Term Preterm Abortions TAB SAB Ect Mult Living                  Review of Systems  Respiratory: Positive for cough and shortness of breath.   All other systems reviewed and are negative.    Allergies  Codeine  Home Medications   Current Outpatient Rx  Name  Route  Sig  Dispense  Refill  . ALBUTEROL SULFATE HFA 108 (90 BASE) MCG/ACT IN AERS   Inhalation   Inhale 2 puffs into the lungs every 6 (six) hours as needed. For shortness of breath.         . ALBUTEROL SULFATE (2.5 MG/3ML) 0.083% IN NEBU   Nebulization   Take 2.5 mg by nebulization every 6 (six) hours as needed. For shortness of breath         . ALPRAZOLAM 1 MG PO TABS   Oral   Take 1 mg by mouth 3 (three) times daily as needed. For anxiety.         . ASPIRIN EC 81 MG PO TBEC  Oral   Take 81 mg by mouth daily.         Marland Kitchen BUTALBITAL-APAP-CAFFEINE 50-325-40 MG PO TABS   Oral   Take 1 tablet by mouth 2 (two) times daily as needed. For headache.         . FUROSEMIDE 20 MG PO TABS   Oral   Take 20 mg by mouth daily.         Marland Kitchen POTASSIUM CHLORIDE CRYS ER 10 MEQ PO TBCR   Oral   Take 10 mEq by mouth daily.         Marland Kitchen PREDNISONE 1 MG PO TABS   Oral   Take 7 mg by mouth daily.         . STOOL SOFTENER & LAXATIVE PO   Oral   Take 1 capsule by mouth daily.         . CEPHALEXIN 500 MG PO CAPS   Oral   Take 1 capsule (500 mg total) by mouth 2 (two) times daily.   20 capsule   0     BP  140/70  Pulse 118  Temp 98 F (36.7 C) (Oral)  Resp 12  SpO2 92%  Physical Exam  Vitals reviewed. Constitutional: She is oriented to person, place, and time.       Chronically ill, audibly wheezing   HENT:  Head: Normocephalic.  Mouth/Throat: Oropharynx is clear and moist.  Eyes: Conjunctivae normal are normal. Pupils are equal, round, and reactive to light.  Neck: Normal range of motion. Neck supple.  Cardiovascular: Normal rate, regular rhythm and normal heart sounds.   Pulmonary/Chest:       Increased effort, diffuse wheezing, no obvious retractions, + tachypneic.   Abdominal: Soft. Bowel sounds are normal. She exhibits no distension. There is no tenderness. There is no rebound.  Musculoskeletal: Normal range of motion.       1+ edema (chronic)   Neurological: She is alert and oriented to person, place, and time.  Skin: Skin is warm and dry.  Psychiatric: She has a normal mood and affect. Her behavior is normal. Judgment and thought content normal.    ED Course  Procedures (including critical care time)  Labs Reviewed  CBC WITH DIFFERENTIAL - Abnormal; Notable for the following:    RBC 3.48 (*)     Hemoglobin 9.4 (*)     HCT 33.6 (*)     MCHC 28.0 (*)     Neutrophils Relative 83 (*)     All other components within normal limits    COMPREHENSIVE METABOLIC PANEL - Abnormal; Notable for the following:    Chloride 94 (*)     CO2 37 (*)     Glucose, Bld 163 (*)     Albumin 3.2 (*)     Total Bilirubin 0.1 (*)     GFR calc non Af Amer 88 (*)     All other components within normal limits  LACTIC ACID, PLASMA   Dg Chest 2 View  01/02/2012  *RADIOLOGY REPORT*  Clinical Data: Shortness of breath.  CHEST - 2 VIEW  Comparison: 09/03/2011  Findings: There is chronic cardiomegaly with chronic or recurrent pulmonary vascular congestion superimposed on severe emphysema.  No acute infiltrates or effusions.  Chronic accentuation of the thoracic kyphosis.  IMPRESSION:  1.  Severe emphysema. 2.  Chronic cardiomegaly with recurrent or persistent pulmonary vascular congestion.   Original Report Authenticated By: Francene Boyers, M.D.      1. COPD exacerbation      Date: 01/02/2012  Rate: 111  Rhythm: sinus tachycardia  QRS Axis: normal  Intervals: normal  ST/T Wave abnormalities: nonspecific ST changes  Conduction Disutrbances:none  Narrative Interpretation: Poor baseline   Old EKG Reviewed: unchanged    MDM  Dana Hess is a 78 y.o. female here with SOB, cough, hypoxia. She likely has COPD exacerbation but will also consider pneumonia. Will get labs, CXR, give nebs, prednisone and reassess. Likely will need admission.   8:35 PM CXR showed no infiltrate. Labs unremarkable. She was given solumedrol and nebs and now feels better. Will admit for COPD exacerbation.         Richardean Canal, MD 01/02/12 2037

## 2012-01-03 DIAGNOSIS — J441 Chronic obstructive pulmonary disease with (acute) exacerbation: Secondary | ICD-10-CM

## 2012-01-03 DIAGNOSIS — R040 Epistaxis: Secondary | ICD-10-CM

## 2012-01-03 DIAGNOSIS — J440 Chronic obstructive pulmonary disease with acute lower respiratory infection: Secondary | ICD-10-CM

## 2012-01-03 LAB — BASIC METABOLIC PANEL
Calcium: 8.9 mg/dL (ref 8.4–10.5)
GFR calc Af Amer: >90 mL/min (ref >90)
GFR calc non Af Amer: >90 mL/min (ref >90)
Glucose, Bld: 147 mg/dL — ABNORMAL HIGH (ref 70–99)
Potassium: 4.3 mEq/L (ref 3.5–5.1)
Sodium: 140 mEq/L (ref 135–145)

## 2012-01-03 LAB — CBC
MCH: 26.7 pg (ref 26.0–34.0)
MCHC: 27.8 g/dL — ABNORMAL LOW (ref 30.0–36.0)
Platelets: 190 10*3/uL (ref 150–400)
RDW: 13.5 % (ref 11.5–15.5)

## 2012-01-03 MED ORDER — TIOTROPIUM BROMIDE MONOHYDRATE 18 MCG IN CAPS
18.0000 ug | ORAL_CAPSULE | Freq: Every day | RESPIRATORY_TRACT | Status: DC
  Administered 2012-01-04: 18 ug via RESPIRATORY_TRACT
  Filled 2012-01-03: qty 5

## 2012-01-03 MED ORDER — METHYLPREDNISOLONE SODIUM SUCC 40 MG IJ SOLR
40.0000 mg | Freq: Two times a day (BID) | INTRAMUSCULAR | Status: DC
  Administered 2012-01-04: 40 mg via INTRAVENOUS
  Filled 2012-01-03 (×3): qty 1

## 2012-01-03 MED ORDER — MAGNESIUM HYDROXIDE 400 MG/5ML PO SUSP
30.0000 mL | Freq: Once | ORAL | Status: AC
  Administered 2012-01-03: 30 mL via ORAL
  Filled 2012-01-03: qty 30

## 2012-01-03 MED ORDER — IPRATROPIUM BROMIDE 0.02 % IN SOLN
0.5000 mg | Freq: Four times a day (QID) | RESPIRATORY_TRACT | Status: DC | PRN
  Administered 2012-01-03: 0.5 mg via RESPIRATORY_TRACT
  Filled 2012-01-03: qty 2.5

## 2012-01-03 MED ORDER — BIOTENE DRY MOUTH MT LIQD
15.0000 mL | Freq: Two times a day (BID) | OROMUCOSAL | Status: DC
  Administered 2012-01-03 – 2012-01-04 (×3): 15 mL via OROMUCOSAL

## 2012-01-03 NOTE — Progress Notes (Signed)
Nutrition Brief Note  RD consulted for assessment of nutrition status and needs.  Body mass index is 23.59 kg/(m^2). Pt meets criteria for wnl based on current BMI.   Pt and daughter state that pt is eating per her usual and pt reports her appetite is per her usual.  Current diet order is Regular, patient is consuming >50% of meals at this time.  Pt states she cannot wear her dentures due to bone erosion.  When asked if this limits her intake and if she plans to get a new pair remade pt states, "I'm not going to worry about it, I eat fine without them." Labs and medications reviewed.   No nutrition interventions warranted at this time. If nutrition issues arise, please consult RD.   Dana Dys, MS RD LDN Clinical Inpatient Dietitian Pager: [phone] Weekend/After hours pager: [phone]

## 2012-01-03 NOTE — Evaluation (Signed)
Occupational Therapy Evaluation Patient Details Name: Dana Hess MRN: [id number] DOB: 03-04-1933 Today's Date: 01/03/2012 Time: 4098-1191 OT Time Calculation (min): 20 min  OT Assessment / Plan / Recommendation Clinical Impression  This 78 year old female was admitted for COPD exacerbation.  She wears 02 all the time and is on 4 liters currently.  Pt has decreased activity tolerance but daughter has been helping her with adls.  Do not anticipate she'll need follow up OT    OT Assessment  Patient needs continued OT Services    Follow Up Recommendations  No OT follow up    Barriers to Discharge      Equipment Recommendations  None recommended by OT;None recommended by PT    Recommendations for Other Services    Frequency  Min 2X/week    Precautions / Restrictions Precautions Precautions: Fall Precaution Comments: Monitor O2 sats.  Restrictions Weight Bearing Restrictions: No   Pertinent Vitals/Pain No pain but dyspnea present 2/4    ADL  Toilet Transfer: Simulated;Minimal assistance (min guard) Toilet Transfer Method: Sit to stand (took 3 steps) Acupuncturist:  (chair to bed. ) Equipment Used:  (pt held iv pole:  didn't want walker) Transfers/Ambulation Related to ADLs: pt had just gotten off 3:1 with daughter.   ADL Comments: Daughter states that she helps pt at home.  Pt has been able to get to tub and take a shower sitting on a seat.  At time of eval, pt had just gotten off commode with daughter's assistance.  She would not be able to get into tub at this time, but possibly could do it after she rested.  Daughter has been helping with LB adls and pt is near baseline for this (mod A).  Educated on energy conservation techniques.      OT Diagnosis: Generalized weakness  OT Problem List: Decreased strength;Decreased activity tolerance;Cardiopulmonary status limiting activity OT Treatment Interventions: Self-care/ADL training;DME and/or AE  instruction;Patient/family education;Energy conservation   OT Goals Acute Rehab OT Goals OT Goal Formulation: With patient Time For Goal Achievement: 01/17/12 Potential to Achieve Goals: Good ADL Goals Pt Will Transfer to Toilet: with supervision;Ambulation;3-in-1 ADL Goal: Toilet Transfer - Progress: Goal set today Pt Will Perform Tub/Shower Transfer: Tub transfer;Ambulation;Shower seat with back;with min assist (min guard) ADL Goal: Tub/Shower Transfer - Progress: Goal set today Miscellaneous OT Goals Miscellaneous OT Goal #1: Pt will initiate rest breaks and pursed lip breathing as needed OT Goal: Miscellaneous Goal #1 - Progress: Goal set today  Visit Information  Last OT Received On: 01/03/12 Assistance Needed: +1    Subjective Data  Subjective: I just want to get back to bed Patient Stated Goal: home   Prior Functioning     Home Living Lives With: Daughter Available Help at Discharge: Family;Available 24 hours/day Type of Home: House Home Access: Stairs to enter [employer] of Steps: 1 Entrance Stairs-Rails: None Home Layout: One level Bathroom Shower/Tub: Engineer, manufacturing systems: Standard Home Adaptive Equipment: Bedside commode/3-in-1;Walker - four wheeled;Other (comment) (seat for tub) Prior Function Level of Independence: Needs assistance (for LB adls) Able to Take Stairs?: Yes Driving: No Vocation: Retired Musician: HOH (difficult to understand)         Vision/Perception     Cognition  Overall Cognitive Status: Appears within functional limits for tasks assessed/performed Arousal/Alertness: Awake/alert Orientation Level: Appears intact for tasks assessed Behavior During Session: [facility] for tasks performed    Extremity/Trunk Assessment Right Upper Extremity Assessment RUE ROM/Strength/Tone: [facility] for tasks assessed  Left Upper Extremity Assessment LUE ROM/Strength/Tone: WFL for tasks assessed Trunk  Assessment Trunk Assessment: Kyphotic     Mobility Bed Mobility Sit to Supine: 5: Supervision Transfers Sit to Stand: 4: Min guard;With upper extremity assist;From chair/3-in-1       Shoulder Instructions     Exercise     Balance     End of Session OT - End of Session Activity Tolerance: Patient limited by fatigue Patient left: in bed;with call bell/phone within reach;with family/visitor present  GO Functional Assessment Tool Used: clinical judgment Functional Limitation: Self care Self Care Current Status (V5643): At least 40 percent but less than 60 percent impaired, limited or restricted Self Care Goal Status (P2951): At least 40 percent but less than 60 percent impaired, limited or restricted   [name] 01/03/2012, 1:33 PM [name], OTR/L [phone] 01/03/2012

## 2012-01-03 NOTE — Progress Notes (Signed)
CRITICAL VALUE ALERT  Critical value received:  CO2- 41  Date of notification:  6:10 AM   Time of notification:  01/03/2012   Critical value read back:yes  Nurse who received alert:  B.Clelia Croft  MD notified (1st page):  [place]  Time of first page:  6:17 AM   MD notified (2nd page):  Time of second page:  Responding MD:  [place]  Time MD responded:  6:40 AM

## 2012-01-03 NOTE — Progress Notes (Signed)
Report given to float RN to assume care of this pt.  Dana Hess. Clelia Croft, RN

## 2012-01-03 NOTE — Progress Notes (Signed)
COPD PROTOCOL Admitted with SOB/COPD; PCP is Dr Carlyon Shadow and Lung specialist is Dr Francina Ames; Lives at home with family members. Patient has home 02. Patient stated that she does not have any problem ambulating and is not interested in any HHC services or pulmonary rehab; she use a walker at home and does not smoke; All prescriptions are filled at [facility] and patient stated that she does not have any problems getting her medication and is very pleased with [facility]. CM will continue to follow for DCP; BChandler RN,BSN,MHA

## 2012-01-03 NOTE — Evaluation (Signed)
Physical Therapy Evaluation Patient Details Name: Dana Hess MRN: [id number] DOB: 18-Jun-1933 Today's Date: 01/03/2012 Time: 4098-1191 PT Time Calculation (min): 36 min  PT Assessment / Plan / Recommendation Clinical Impression  Pt presents with COPD exacerbation on chronic oxygen use at home.  Tolerated OOB and agreed to some ambulation in hallway.  Checked O2 sats prior to ambulation on 4 L in room with O2 sats at 76-86%.  Increased O2 to 6L while ambulating with O2 sats at 86% HR 101 following ambulation.  RN notified.  Educated pt on importance of pursed lip breathing with pt stating that "she can't."  Pt will benefit from skilled PT in acute venue to address deficits.  PT recommends HHPT for follow up at D/C to maximize pts safety and independence.      PT Assessment  Patient needs continued PT services    Follow Up Recommendations  Home health PT;Supervision for mobility/OOB    Does the patient have the potential to tolerate intense rehabilitation      Barriers to Discharge None      Equipment Recommendations  None recommended by PT    Recommendations for Other Services OT consult   Frequency Min 3X/week    Precautions / Restrictions Precautions Precautions: Fall Precaution Comments: Monitor O2 sats.  Restrictions Weight Bearing Restrictions: No   Pertinent Vitals/Pain No pain, see comments about O2 sats      Mobility  Bed Mobility Bed Mobility: Supine to Sit;Sitting - Scoot to Edge of Bed Supine to Sit: 4: Min guard;HOB elevated;With rails Sitting - Scoot to Edge of Bed: 5: Supervision Details for Bed Mobility Assistance: Min/guard for trunk when getting to EOB.  Requires cues for hand placement.  Pt/daughter states she had no trouble getting OOB PTA, however pt wanted to elevate HOB when getting up.  She does not have hospital bed at home.  Transfers Transfers: Sit to Stand;Stand to Sit Sit to Stand: 4: Min guard;With upper extremity assist;From bed Stand  to Sit: 4: Min guard;With upper extremity assist;With armrests;To chair/3-in-1 Details for Transfer Assistance: Min/guard for safety with max cues for hand placement and keeping RW with her all the way to seating surface to ensure safety.  Ambulation/Gait Ambulation/Gait Assistance: 4: Min guard Ambulation Distance (Feet): 75 Feet Assistive device: Rolling walker Ambulation/Gait Assistance Details: Cues for safety and to continue pursed lip breathing while ambulating.  See O2 sats in comments.  Gait Pattern: Step-through pattern;Decreased stride length;Trunk flexed Gait velocity: decreased Stairs: No Wheelchair Mobility Wheelchair Mobility: No    Shoulder Instructions     Exercises     PT Diagnosis: Difficulty walking;Generalized weakness  PT Problem List: Decreased strength;Decreased activity tolerance;Decreased balance;Decreased mobility;Decreased knowledge of use of DME;Decreased safety awareness;Decreased knowledge of precautions;Cardiopulmonary status limiting activity PT Treatment Interventions: DME instruction;Gait training;Stair training;Functional mobility training;Therapeutic activities;Therapeutic exercise;Balance training;Patient/family education   PT Goals Acute Rehab PT Goals PT Goal Formulation: With patient/family Time For Goal Achievement: 01/10/12 Potential to Achieve Goals: Good Pt will go Supine/Side to Sit: with supervision PT Goal: Supine/Side to Sit - Progress: Goal set today Pt will go Sit to Supine/Side: with supervision PT Goal: Sit to Supine/Side - Progress: Goal set today Pt will go Sit to Stand: with supervision PT Goal: Sit to Stand - Progress: Goal set today Pt will go Stand to Sit: with modified independence PT Goal: Stand to Sit - Progress: Goal set today Pt will Ambulate: 51 - 150 feet;with supervision;with least restrictive assistive device PT Goal: Ambulate - Progress:  Goal set today Pt will Go Up / Down Stairs: 1-2 stairs;with supervision;with  least restrictive assistive device PT Goal: Up/Down Stairs - Progress: Goal set today  Visit Information  Last PT Received On: 01/03/12 Assistance Needed: +1    Subjective Data  Subjective: This walker is not like the one I use at home.  Patient Stated Goal: to get back home.    Prior Functioning  Home Living Lives With: Daughter Available Help at Discharge: Family;Available 24 hours/day Type of Home: House Home Access: Stairs to enter [employer] of Steps: 1 Entrance Stairs-Rails: None Home Layout: One level Bathroom Shower/Tub: Engineer, manufacturing systems: Standard Home Adaptive Equipment: Bedside commode/3-in-1;Walker - four wheeled Prior Function Level of Independence: Independent with assistive device(s) Able to Take Stairs?: Yes Driving: No Vocation: Retired Musician: Clinical cytogeneticist  Overall Cognitive Status: Appears within functional limits for tasks assessed/performed Arousal/Alertness: Awake/alert Orientation Level: Appears intact for tasks assessed Behavior During Session: [facility] for tasks performed    Extremity/Trunk Assessment Right Lower Extremity Assessment RLE ROM/Strength/Tone: Deficits RLE ROM/Strength/Tone Deficits: Generalized weakness, grossly 3/5 per functional assessment.  RLE Sensation: WFL - Light Touch Left Lower Extremity Assessment LLE ROM/Strength/Tone: Deficits LLE ROM/Strength/Tone Deficits: Generalized weakness, grossly 3/5 per functional assessment.  LLE Sensation: WFL - Light Touch Trunk Assessment Trunk Assessment: Kyphotic   Balance    End of Session PT - End of Session Equipment Utilized During Treatment: Oxygen Activity Tolerance: Patient limited by fatigue Patient left: in chair;with call bell/phone within reach Nurse Communication: Mobility status;Other (comment) (O2 sats)  GP Functional Assessment Tool Used: Clinical judgement.  Functional Limitation: Mobility: Walking and moving  around Mobility: Walking and Moving Around Current Status [phone]): At least 20 percent but less than 40 percent impaired, limited or restricted Mobility: Walking and Moving Around Goal Status [phone]): At least 1 percent but less than 20 percent impaired, limited or restricted   Lessie Dings 01/03/2012, 10:27 AM

## 2012-01-03 NOTE — Progress Notes (Signed)
Patient ID: Dana Hess, female   DOB: [birth year], 78 y.o.   MRN: [id number]  [facility] PROGRESS NOTE  Dana Hess WUJ:[id number] DOB: August 19, 1933 DOA: 01/02/2012 PCP: [name], MD  Brief narrative: Pt is 78 y.o. Female with h/o severe COPD on 4L home O2 at baseline for past 14 years who presented to ED with SOB, cough productive of yellow sputum. Worsening pulm status onset over past couple of days, daughter initially worried that this is PNA. Had to increase O2 at home to 5L which helped some. No fevers, using nebs more frequently.  Evaluation in the ED demonstrated no PNA on CXR but severe emphysema. Her wheezing was improved in ED with steroids and respiratory treatment. Hospitalist has been asked to admit patient for COPD exacerbation, acute on chronic bronchitis.  Principal Problem:  *COPD with exacerbation - clinically improved since admission - minimal wheezing on exam this AM - will continue antibiotic Levaquin, continue nebulizers as needed and scheduled, oxygen 4 L, solumedrol - plan on tapering down steroids in AM Active Problems:  Acute bronchitis with chronic obstructive pulmonary disease (COPD) - please see principal problem - continue antibiotics as noted above  Venous stasis of lower extremity - chronic and pt explains it is at her baseline Anemia of chronic disease - Hg and Hct are stable and at pt's baseline - CBC in AM  Consultants:  None  Procedures/Studies: Dg Chest 2 View 01/02/2012    IMPRESSION:   1.  Severe emphysema.  2.  Chronic cardiomegaly with recurrent or persistent pulmonary vascular congestion.     Antibiotics:  Levaquin 11/14 -->  Code Status: Full Family Communication: Pt at bedside Disposition Plan: Home when medically stable  HPI/Subjective: No events overnight.   Objective: Filed Vitals:   01/03/12 0458 01/03/12 0840 01/03/12 1141 01/03/12 1435  BP: 133/55   128/71  Pulse: 90   99  Temp: 98.4 F (36.9  C)   98.7 F (37.1 C)  TempSrc: Oral   Oral  Resp:    22  Height:      Weight:      SpO2: 94% 92% 92% 97%    Intake/Output Summary (Last 24 hours) at 01/03/12 1442 Last data filed at 01/03/12 0900  Gross per 24 hour  Intake    120 ml  Output      0 ml  Net    120 ml    Exam:   General:  Pt is alert, follows commands appropriately, not in acute distress  Cardiovascular: Regular rate and rhythm, S1/S2, no murmurs, no rubs, no gallops  Respiratory: Clear to auscultation bilaterally, mild end expiratory wheezing  Abdomen: Soft, non tender, non distended, bowel sounds present, no guarding  Extremities: No edema, pulses DP and PT palpable bilaterally, bilateral chronic venous stasis changes of the lower extremities   Neuro: Grossly nonfocal  Data Reviewed: Basic Metabolic Panel:  Lab 01/03/12 9562 01/02/12 1910  NA 140 141  K 4.3 4.2  CL 95* 94*  CO2 41* 37*  GLUCOSE 147* 163*  BUN 21 20  CREATININE 0.51 0.56  CALCIUM 8.9 9.1  MG -- --  PHOS -- --   Liver Function Tests:  Lab 01/02/12 1910  AST 19  ALT 12  ALKPHOS 63  BILITOT 0.1*  PROT 6.8  ALBUMIN 3.2*   CBC:  Lab 01/03/12 0435 01/02/12 1910  WBC 5.5 7.4  NEUTROABS -- 6.1  HGB 8.7* 9.4*  HCT 31.3* 33.6*  MCV 96.0 96.6  PLT  190 202   Scheduled Meds:   . albuterol  2.5 mg Nebulization Q6H  . [COMPLETED] albuterol  5 mg Nebulization Q30 min  . aspirin EC  81 mg Oral Daily  . furosemide  20 mg Oral Daily  . [COMPLETED] ipratropium  0.5 mg Nebulization Q30 min  . levofloxacin (LEVAQUIN) IV  500 mg Intravenous Q24H  . SOLU-MEDROL injection  40 mg Intravenous Q6H  . potassium chloride  10 mEq Oral Daily  . sodium chloride  3 mL Intravenous Q12H  . tiotropium  18 mcg Inhalation Daily   Continuous Infusions:    Debbora Presto, MD  [facility] Pager [phone]  If 7PM-7AM, please contact night-coverage [email or web address] Password [email or web address] 01/03/2012, 2:42 PM   LOS: 1 day

## 2012-01-04 DIAGNOSIS — I872 Venous insufficiency (chronic) (peripheral): Secondary | ICD-10-CM

## 2012-01-04 LAB — CBC
HCT: 28.2 % — ABNORMAL LOW (ref 36.0–46.0)
Hemoglobin: 8.2 g/dL — ABNORMAL LOW (ref 12.0–15.0)
MCV: 92.8 fL (ref 78.0–100.0)
WBC: 8.1 10*3/uL (ref 4.0–10.5)

## 2012-01-04 LAB — BASIC METABOLIC PANEL
BUN: 31 mg/dL — ABNORMAL HIGH (ref 6–23)
Chloride: 96 mEq/L (ref 96–112)
Creatinine, Ser: 0.65 mg/dL (ref 0.50–1.10)
Glucose, Bld: 100 mg/dL — ABNORMAL HIGH (ref 70–99)
Potassium: 3.8 mEq/L (ref 3.5–5.1)

## 2012-01-04 MED ORDER — PREDNISONE 10 MG PO TABS: ORAL_TABLET | ORAL | Status: DC

## 2012-01-04 MED ORDER — GUAIFENESIN ER 600 MG PO TB12
600.0000 mg | ORAL_TABLET | Freq: Two times a day (BID) | ORAL | Status: DC
  Administered 2012-01-04: 600 mg via ORAL
  Filled 2012-01-04 (×2): qty 1

## 2012-01-04 MED ORDER — TIOTROPIUM BROMIDE MONOHYDRATE 18 MCG IN CAPS: 18.0000 ug | ORAL_CAPSULE | Freq: Every day | RESPIRATORY_TRACT | Status: DC

## 2012-01-04 MED ORDER — LEVOFLOXACIN 500 MG PO TABS
500.0000 mg | ORAL_TABLET | Freq: Every day | ORAL | Status: DC
  Administered 2012-01-04: 500 mg via ORAL
  Filled 2012-01-04: qty 1

## 2012-01-04 MED ORDER — LEVOFLOXACIN 500 MG PO TABS: 500.0000 mg | ORAL_TABLET | Freq: Every day | ORAL | Status: DC

## 2012-01-04 MED ORDER — GUAIFENESIN ER 600 MG PO TB12: 600.0000 mg | ORAL_TABLET | Freq: Two times a day (BID) | ORAL | Status: DC

## 2012-01-04 NOTE — Discharge Summary (Addendum)
Physician Discharge Summary  Dana Hess AVW:[id number] DOB: 11-08-1933 DOA: 01/02/2012  PCP: [name], MD  Admit date: 01/02/2012 Discharge date: 01/04/2012  Time spent: 40 minutes  Recommendations for Outpatient Follow-up:  1. Follow up with PMD.   Discharge Diagnoses:  Principal Problem:  *COPD with exacerbation Active Problems:  Acute bronchitis with chronic obstructive pulmonary disease (COPD)  Venous stasis of lower extremity  Epistaxis   Discharge Condition: Satisfactory.  Diet recommendation: Regular.   Filed Weights   01/02/12 2230  Weight: 54.8 kg (120 lb 13 oz)    History of present illness:  78 y.o. female with h/o severe COPD on 4L home O2 at baseline for past 14 years, presenting to ED with SOB, cough productive of yellow sputum and worsening respiratory status onset over past couple of Daughter initially worried that this is PNA as patient just had hospitalization for this recently, and had to increase O2 at home to 5L which helped some. No fevers, using nebs more frequently. Evaluation in the ED demonstrated no PNA on CXR but severe emphysema. Wheezing was improved in ED with steroids and respiratory treatment, and patient was admitted for further management.    Hospital Course:  1. COPD with exacerbation:  -Patient presented with worsening SOB/Wheeze, as well as a cough, productive of yellow phlegm. Wcc was normal, she was apyrexial, and CXR was devoid of pneumonic consolidation. Clearly, she has an infective exacerbation of advanced oxygen-dependent COPD, secondary to acute bronchitis. Managed with Levaquin, nebulizers, oxygen supplementation and steroids, with satisfactory response. As of 01/04/12, patient felt much better, and was keen to be discharged. She was transitioned to oral steroid taper, as well as oral antibiotics on that date.  2. Acute bronchitis: This is the culprit for patient's acute presentation. See above discussion.  3. Venous  stasis of lower extremity:   Thi ss chronic and not problematic.  4. Anemia of chronic disease  Hb and Hct are stable and at patient's baseline.      Procedures:  See below.   Consultations:  N/A.   Discharge Exam: Filed Vitals:   01/03/12 2048 01/04/12 0215 01/04/12 0559 01/04/12 0911  BP: 141/66  101/46   Pulse: 111  93   Temp: 98.8 F (37.1 C)  98.7 F (37.1 C)   TempSrc: Oral  Oral   Resp: 20  18   Height:      Weight:      SpO2: 96% 95% 100% 92%    General: Comfortable, alert, communicative, fully oriented, not short of breath at rest. Only mildly productive cough.  HEENT: Mild clinical pallor, no jaundice, no conjunctival injection or discharge.  NECK: Supple, JVP not seen, no carotid bruits, no palpable lymphadenopathy, no palpable goiter.  CHEST: Clinically clear to auscultation, no wheezes, no crackles.  HEART: Sounds 1 and 2 heard, normal, regular, no murmurs.  ABDOMEN: Moderately obese, soft, non-tender, no palpable organomegaly, no palpable masses, normal bowel sounds.  GENITALIA: Not examined.  LOWER EXTREMITIES: Minimal pitting edema, palpable peripheral pulses.  MUSCULOSKELETAL SYSTEM: Generalized osteoarthritic changes, otherwise, normal.  CENTRAL NERVOUS SYSTEM: No focal neurologic deficit on gross examination.  Discharge Instructions      Discharge Orders    Future Orders Please Complete By Expires   Diet general      Increase activity slowly          Medication List     As of 01/04/2012  1:01 PM    STOP taking these medications  cephALEXin 500 MG capsule   Commonly known as: KEFLEX      TAKE these medications         albuterol 108 (90 BASE) MCG/ACT inhaler   Commonly known as: PROVENTIL HFA;VENTOLIN HFA   Inhale 2 puffs into the lungs every 6 (six) hours as needed. For shortness of breath.      albuterol (2.5 MG/3ML) 0.083% nebulizer solution   Commonly known as: PROVENTIL   Take 2.5 mg by nebulization every 6 (six)  hours as needed. For shortness of breath      ALPRAZolam 1 MG tablet   Commonly known as: XANAX   Take 1 mg by mouth 3 (three) times daily as needed. For anxiety.      aspirin EC 81 MG tablet   Take 81 mg by mouth daily.      butalbital-acetaminophen-caffeine 50-325-40 MG per tablet   Commonly known as: FIORICET, ESGIC   Take 1 tablet by mouth 2 (two) times daily as needed. For headache.      furosemide 20 MG tablet   Commonly known as: LASIX   Take 20 mg by mouth daily.      guaiFENesin 600 MG 12 hr tablet   Commonly known as: MUCINEX   Take 1 tablet (600 mg total) by mouth 2 (two) times daily.      levofloxacin 500 MG tablet   Commonly known as: LEVAQUIN   Take 1 tablet (500 mg total) by mouth daily.      potassium chloride 10 MEQ tablet   Commonly known as: K-DUR,KLOR-CON   Take 10 mEq by mouth daily.      predniSONE 10 MG tablet   Commonly known as: DELTASONE   Take 40 mg daily for 3 days, then 30 mg daily for 3 days, then 20 mg daily for 3 days, then 10 mg daily for 3 days, then stop.      STOOL SOFTENER & LAXATIVE PO   Take 1 capsule by mouth daily.      tiotropium 18 MCG inhalation capsule   Commonly known as: SPIRIVA   Place 1 capsule (18 mcg total) into inhaler and inhale daily.         Follow-up Information    Follow up with [name], MD. In 1 week.   Contact information:   [address] [place] [address] [phone]           The results of significant diagnostics from this hospitalization (including imaging, microbiology, ancillary and laboratory) are listed below for reference.    Significant Diagnostic Studies: Dg Chest 2 View  01/02/2012  *RADIOLOGY REPORT*  Clinical Data: Shortness of breath.  CHEST - 2 VIEW  Comparison: 09/03/2011  Findings: There is chronic cardiomegaly with chronic or recurrent pulmonary vascular congestion superimposed on severe emphysema.  No acute infiltrates or effusions.  Chronic accentuation of  the thoracic kyphosis.  IMPRESSION:  1.  Severe emphysema. 2.  Chronic cardiomegaly with recurrent or persistent pulmonary vascular congestion.   Original Report Authenticated By: Francene Boyers, M.D.     Microbiology: No results found for this or any previous visit (from the past 240 hour(s)).   Labs: Basic Metabolic Panel:  Lab 01/04/12 9147 01/03/12 0435 01/02/12 1910  NA 141 140 141  K 3.8 4.3 4.2  CL 96 95* 94*  CO2 43* 41* 37*  GLUCOSE 100* 147* 163*  BUN 31* 21 20  CREATININE 0.65 0.51 0.56  CALCIUM 9.3 8.9 9.1  MG -- -- --  PHOS -- -- --   Liver Function Tests:  Lab 01/02/12 1910  AST 19  ALT 12  ALKPHOS 63  BILITOT 0.1*  PROT 6.8  ALBUMIN 3.2*   No results found for this basename: LIPASE:5,AMYLASE:5 in the last 168 hours No results found for this basename: AMMONIA:5 in the last 168 hours CBC:  Lab 01/04/12 0454 01/03/12 0435 01/02/12 1910  WBC 8.1 5.5 7.4  NEUTROABS -- -- 6.1  HGB 8.2* 8.7* 9.4*  HCT 28.2* 31.3* 33.6*  MCV 92.8 96.0 96.6  PLT 191 190 202   Cardiac Enzymes: No results found for this basename: CKTOTAL:5,CKMB:5,CKMBINDEX:5,TROPONINI:5 in the last 168 hours BNP: BNP (last 3 results) No results found for this basename: PROBNP:3 in the last 8760 hours CBG: No results found for this basename: GLUCAP:5 in the last 168 hours     Signed:  Zalaya Astarita,CHRISTOPHER  [facility] 01/04/2012, 1:01 PM

## 2012-06-19 ENCOUNTER — Inpatient Hospital Stay (HOSPITAL_COMMUNITY)
Admission: EM | Admit: 2012-06-19 | Discharge: 2012-06-22 | DRG: 193 | Disposition: A | Discharge status: Home / Self Care | Payer: Medicare Other | Attending: Internal Medicine | Admitting: Internal Medicine

## 2012-06-19 ENCOUNTER — Emergency Department (HOSPITAL_COMMUNITY): Payer: Medicare Other

## 2012-06-19 ENCOUNTER — Encounter (HOSPITAL_COMMUNITY): Payer: Self-pay

## 2012-06-19 DIAGNOSIS — J209 Acute bronchitis, unspecified: Secondary | ICD-10-CM

## 2012-06-19 DIAGNOSIS — D638 Anemia in other chronic diseases classified elsewhere: Secondary | ICD-10-CM | POA: Diagnosis present

## 2012-06-19 DIAGNOSIS — J441 Chronic obstructive pulmonary disease with (acute) exacerbation: Secondary | ICD-10-CM

## 2012-06-19 DIAGNOSIS — Z66 Do not resuscitate: Secondary | ICD-10-CM | POA: Diagnosis present

## 2012-06-19 DIAGNOSIS — K219 Gastro-esophageal reflux disease without esophagitis: Secondary | ICD-10-CM | POA: Diagnosis present

## 2012-06-19 DIAGNOSIS — J189 Pneumonia, unspecified organism: Principal | ICD-10-CM

## 2012-06-19 DIAGNOSIS — I878 Other specified disorders of veins: Secondary | ICD-10-CM

## 2012-06-19 DIAGNOSIS — Z87891 Personal history of nicotine dependence: Secondary | ICD-10-CM

## 2012-06-19 DIAGNOSIS — J440 Chronic obstructive pulmonary disease with acute lower respiratory infection: Secondary | ICD-10-CM

## 2012-06-19 DIAGNOSIS — J9601 Acute respiratory failure with hypoxia: Secondary | ICD-10-CM | POA: Diagnosis present

## 2012-06-19 DIAGNOSIS — J9620 Acute and chronic respiratory failure, unspecified whether with hypoxia or hypercapnia: Secondary | ICD-10-CM

## 2012-06-19 DIAGNOSIS — R040 Epistaxis: Secondary | ICD-10-CM

## 2012-06-19 HISTORY — DX: Pneumonia, unspecified organism: J18.9

## 2012-06-19 HISTORY — DX: Gastro-esophageal reflux disease without esophagitis: K21.9

## 2012-06-19 LAB — BLOOD GAS, ARTERIAL
Acid-Base Excess: 15.6 mmol/L — ABNORMAL HIGH (ref 0.0–2.0)
Bicarbonate: 43.5 mEq/L — ABNORMAL HIGH (ref 20.0–24.0)
Patient temperature: 98.6
TCO2: 42.5 mmol/L (ref 0–100)
pH, Arterial: 7.311 — ABNORMAL LOW (ref 7.350–7.450)

## 2012-06-19 LAB — CBC
HCT: 25.5 % — ABNORMAL LOW (ref 36.0–46.0)
HCT: 28.1 % — ABNORMAL LOW (ref 36.0–46.0)
Hemoglobin: 6.9 g/dL — CL (ref 12.0–15.0)
MCH: 21.9 pg — ABNORMAL LOW (ref 26.0–34.0)
MCH: 20.8 pg — ABNORMAL LOW (ref 26.0–34.0)
MCHC: 25.9 g/dL — ABNORMAL LOW (ref 30.0–36.0)
MCV: 84.7 fL (ref 78.0–100.0)
MCV: 84.6 fL (ref 78.0–100.0)
RBC: 3.32 MIL/uL — ABNORMAL LOW (ref 3.87–5.11)
RDW: 17.2 % — ABNORMAL HIGH (ref 11.5–15.5)
WBC: 8.6 10*3/uL (ref 4.0–10.5)
WBC: 9.7 10*3/uL (ref 4.0–10.5)

## 2012-06-19 LAB — BASIC METABOLIC PANEL
BUN: 16 mg/dL (ref 6–23)
CO2: >45 mEq/L (ref 19–32)
Chloride: 94 mEq/L — ABNORMAL LOW (ref 96–112)
Creatinine, Ser: 0.45 mg/dL — ABNORMAL LOW (ref 0.50–1.10)
Glucose, Bld: 118 mg/dL — ABNORMAL HIGH (ref 70–99)

## 2012-06-19 LAB — MRSA PCR SCREENING: MRSA by PCR: NEGATIVE

## 2012-06-19 MED ORDER — GUAIFENESIN 100 MG/5ML PO SOLN
10.0000 mL | ORAL | Status: DC | PRN
Start: 2012-06-19 — End: 2012-06-20
  Filled 2012-06-19: qty 10

## 2012-06-19 MED ORDER — ENOXAPARIN SODIUM 30 MG/0.3ML ~~LOC~~ SOLN
30.0000 mg | SUBCUTANEOUS | Status: DC
Start: 2012-06-19 — End: 2012-06-19
  Filled 2012-06-19: qty 0.3

## 2012-06-19 MED ORDER — SODIUM CHLORIDE 0.9 % IJ SOLN
3.0000 mL | Freq: Two times a day (BID) | INTRAMUSCULAR | Status: DC
  Administered 2012-06-21 – 2012-06-22 (×3): 3 mL via INTRAVENOUS

## 2012-06-19 MED ORDER — BUTALBITAL-APAP-CAFFEINE 50-325-40 MG PO TABS
1.0000 | ORAL_TABLET | Freq: Once | ORAL | Status: AC | PRN
  Administered 2012-06-19: 1 via ORAL

## 2012-06-19 MED ORDER — POTASSIUM CHLORIDE CRYS ER 10 MEQ PO TBCR
10.0000 meq | EXTENDED_RELEASE_TABLET | Freq: Every day | ORAL | Status: DC
  Administered 2012-06-19 – 2012-06-22 (×4): 10 meq via ORAL
  Filled 2012-06-19 (×4): qty 1

## 2012-06-19 MED ORDER — SODIUM CHLORIDE 0.9 % IJ SOLN
3.0000 mL | INTRAMUSCULAR | Status: DC | PRN
  Administered 2012-06-21: 3 mL via INTRAVENOUS

## 2012-06-19 MED ORDER — ALBUTEROL (5 MG/ML) CONTINUOUS INHALATION SOLN
10.0000 mg/h | INHALATION_SOLUTION | Freq: Once | RESPIRATORY_TRACT | Status: AC
  Administered 2012-06-19: 10 mg/h via RESPIRATORY_TRACT
  Filled 2012-06-19: qty 20

## 2012-06-19 MED ORDER — SODIUM CHLORIDE 0.9 % IV SOLN: 250.0000 mL | INTRAVENOUS | Status: DC | PRN

## 2012-06-19 MED ORDER — ALBUTEROL SULFATE (5 MG/ML) 0.5% IN NEBU
2.5000 mg | INHALATION_SOLUTION | RESPIRATORY_TRACT | Status: DC | PRN
  Administered 2012-06-20 – 2012-06-22 (×2): 2.5 mg via RESPIRATORY_TRACT
  Filled 2012-06-19 (×2): qty 0.5

## 2012-06-19 MED ORDER — LORAZEPAM 2 MG/ML IJ SOLN
1.0000 mg | Freq: Once | INTRAMUSCULAR | Status: AC
  Administered 2012-06-19: 1 mg via INTRAVENOUS
  Filled 2012-06-19: qty 1

## 2012-06-19 MED ORDER — ALPRAZOLAM 0.25 MG PO TABS
0.2500 mg | ORAL_TABLET | Freq: Three times a day (TID) | ORAL | Status: DC | PRN
Start: 2012-06-19 — End: 2012-06-20
  Administered 2012-06-19 (×2): 0.25 mg via ORAL
  Filled 2012-06-19 (×3): qty 1

## 2012-06-19 MED ORDER — LEVOFLOXACIN IN D5W 500 MG/100ML IV SOLN
500.0000 mg | Freq: Once | INTRAVENOUS | Status: AC
  Administered 2012-06-19: 500 mg via INTRAVENOUS
  Filled 2012-06-19: qty 100

## 2012-06-19 MED ORDER — METHYLPREDNISOLONE SODIUM SUCC 125 MG IJ SOLR
125.0000 mg | Freq: Four times a day (QID) | INTRAMUSCULAR | Status: DC
  Administered 2012-06-19 – 2012-06-20 (×3): 125 mg via INTRAVENOUS
  Filled 2012-06-19 (×7): qty 2

## 2012-06-19 MED ORDER — PANTOPRAZOLE SODIUM 40 MG PO TBEC
40.0000 mg | DELAYED_RELEASE_TABLET | Freq: Every day | ORAL | Status: DC
  Administered 2012-06-19 – 2012-06-20 (×2): 40 mg via ORAL
  Filled 2012-06-19 (×2): qty 1

## 2012-06-19 MED ORDER — FUROSEMIDE 20 MG PO TABS
20.0000 mg | ORAL_TABLET | Freq: Every day | ORAL | Status: DC
  Administered 2012-06-20 – 2012-06-21 (×2): 20 mg via ORAL
  Filled 2012-06-19 (×4): qty 1

## 2012-06-19 MED ORDER — METHYLPREDNISOLONE SODIUM SUCC 125 MG IJ SOLR
125.0000 mg | Freq: Once | INTRAMUSCULAR | Status: AC
  Administered 2012-06-19: 125 mg via INTRAVENOUS
  Filled 2012-06-19: qty 2

## 2012-06-19 MED ORDER — ALBUTEROL SULFATE (5 MG/ML) 0.5% IN NEBU: 2.5000 mg | INHALATION_SOLUTION | RESPIRATORY_TRACT | Status: DC | PRN

## 2012-06-19 MED ORDER — GUAIFENESIN 100 MG/5ML PO SYRP
200.0000 mg | ORAL_SOLUTION | ORAL | Status: DC | PRN
  Filled 2012-06-19: qty 118

## 2012-06-19 MED ORDER — ALBUTEROL SULFATE (5 MG/ML) 0.5% IN NEBU
2.5000 mg | INHALATION_SOLUTION | Freq: Four times a day (QID) | RESPIRATORY_TRACT | Status: DC
  Administered 2012-06-19 – 2012-06-22 (×9): 2.5 mg via RESPIRATORY_TRACT
  Filled 2012-06-19 (×9): qty 0.5

## 2012-06-19 MED ORDER — SODIUM CHLORIDE 0.9 % IV SOLN
Freq: Once | INTRAVENOUS | Status: AC
  Administered 2012-06-19: 15:00:00 via INTRAVENOUS

## 2012-06-19 MED ORDER — VITAMINS A & D EX OINT
TOPICAL_OINTMENT | CUTANEOUS | Status: AC
  Administered 2012-06-19: 19:00:00
  Filled 2012-06-19: qty 5

## 2012-06-19 MED ORDER — ASPIRIN EC 81 MG PO TBEC
81.0000 mg | DELAYED_RELEASE_TABLET | Freq: Every day | ORAL | Status: DC
  Administered 2012-06-19 – 2012-06-21 (×3): 81 mg via ORAL
  Filled 2012-06-19 (×6): qty 1

## 2012-06-19 MED ORDER — LORAZEPAM 2 MG/ML IJ SOLN: 1.0000 mg | Freq: Three times a day (TID) | INTRAMUSCULAR | Status: DC | PRN

## 2012-06-19 MED ORDER — LEVOFLOXACIN IN D5W 750 MG/150ML IV SOLN
750.0000 mg | INTRAVENOUS | Status: DC
  Administered 2012-06-19: 750 mg via INTRAVENOUS
  Filled 2012-06-19 (×2): qty 150

## 2012-06-19 MED ORDER — ALBUTEROL SULFATE (5 MG/ML) 0.5% IN NEBU: 5.0000 mg | INHALATION_SOLUTION | Freq: Once | RESPIRATORY_TRACT | Status: DC

## 2012-06-19 NOTE — ED Notes (Signed)
MD at bedside. 

## 2012-06-19 NOTE — ED Notes (Signed)
Per EMS, Pt, from home, c/o increasing SOB x 2 days.  Hx of COPD and seasonal allergies.  Pt on non-rebreather 10L, in route, and O2 Sat 100%.  Vitals stable.  A & Ox4.

## 2012-06-19 NOTE — Plan of Care (Signed)
Problem: ICU Phase Progression Outcomes Goal: O2 sats trending toward baseline Outcome: Not Progressing Unable to titrate off non-rebreather mask

## 2012-06-19 NOTE — ED Notes (Addendum)
Pt sts SOB "for a couple days."  Sts "I felt like I had a heart attack" x 2 nights ago.  Pt normally on 4.5L  at home.    EMS had to put Pt on 10L  Non-rebreather to get O2 sat elevated.  This RN put PT on Venturi mask 40%  12L.  O2 sat 94%.

## 2012-06-19 NOTE — ED Notes (Signed)
XR at bedside

## 2012-06-19 NOTE — H&P (Signed)
[facility] History and Physical  Dana Hess JXB:[id number] DOB: Sep 01, 1933 DOA: 06/19/2012  Referring physician: ED physician PCP: [name], MD   Chief Complaint: Shortness of breath   HPI:  Pt is 79 yo female with COPD/Emphysema who presented to [facility] ED with main concern of progressively worsening shortness of breath that initially started several days prior to admission, present mostly with exertion initially but now present at rest as well, associated with subjective fevers, chills, cough productive of yellow sputum, poor oral intake. No specific alleviating or aggravating factors, no abdominal or urinary concerns, no chest pain. Pt reports similar events in the past but not of the same intensity.   In ED, pt with oxygen saturations in 80's on BiPAP, uncomfortable, CXR indicative of PNA. Pt expressed that she wants to have code status DNR, family at bedside agreed. [facility] asked to admit for further management of COPD, PNA.   Assessment and Plan:  Principal Problem:   Acute and chronic respiratory failure - likely secondary to acute on chronic COPD exacerbation with PNA - will admit to SDU as pt requiring BiPAP at this time - will start with providing supportive care with nebulizers scheduled and as needed, solumedrol, empiric ABX - will obtain sputum culture and gram stain, urine legionella and strep pneumo - pt DNR and expressed desire to be comfortable, no intubation or CPR Active Problems:   Anemia of chronic disease - somewhat lower Hg compared to her baseline 8-9 - will place order for 2 units of PRBC to be transfused - CBC in AM, hold heparin products   Code Status: DNR Family Communication: Pt and family at bedside Disposition Plan: Admit to SDU   Review of Systems:  Constitutional: Positive for fever, chills and malaise/fatigue. Negative for diaphoresis.  HENT: Negative for hearing loss, ear pain, nosebleeds, congestion, sore throat, neck pain, tinnitus  and ear discharge.   Eyes: Negative for blurred vision, double vision, photophobia, pain, discharge and redness.  Respiratory: Positive for cough, sputum production, shortness of breath, wheezing.   Cardiovascular: Negative for chest pain, palpitations, orthopnea, claudication and leg swelling.  Gastrointestinal: Negative for nausea, vomiting and abdominal pain. Negative for heartburn, constipation, blood in stool and melena.  Genitourinary: Negative for dysuria, urgency, frequency, hematuria and flank pain.  Musculoskeletal: Negative for myalgias, back pain, joint pain and falls.  Skin: Negative for itching and rash.  Neurological: Negative for dizziness and weakness. Negative for tingling, tremors, sensory change, speech change, focal weakness, loss of consciousness and headaches.  Endo/Heme/Allergies: Negative for environmental allergies and polydipsia. Does not bruise/bleed easily.  Psychiatric/Behavioral: Negative for suicidal ideas. The patient is not nervous/anxious.      Past Medical History  Diagnosis Date  . COPD (chronic obstructive pulmonary disease)   . Arthritis   . Emphysema   . GERD (gastroesophageal reflux disease)   . Pneumonia 06/19/2012    Past Surgical History  Procedure Laterality Date  . Abdominal hysterectomy    . Breast surgery      left  . Back surgery      Social History:  reports that she quit smoking about 17 years ago. She has never used smokeless tobacco. She reports that she does not drink alcohol or use illicit drugs.  Allergies  Allergen Reactions  . Codeine Nausea And Vomiting and Rash    Family medical history of HTN   Prior to Admission medications   Medication Sig Start Date End Date Taking? Authorizing Provider  albuterol (PROVENTIL HFA;VENTOLIN HFA)  108 (90 BASE) MCG/ACT inhaler Inhale 2 puffs into the lungs every 6 (six) hours as needed. For shortness of breath.   Yes Historical Provider, MD  albuterol (PROVENTIL) (2.5 MG/3ML) 0.083%  nebulizer solution Take 2.5 mg by nebulization every 6 (six) hours as needed. For shortness of breath   Yes Historical Provider, MD  ALPRAZolam Prudy Feeler) 1 MG tablet Take 1 mg by mouth 3 (three) times daily as needed. For anxiety.   Yes Historical Provider, MD  aspirin EC 81 MG tablet Take 81 mg by mouth daily.   Yes Historical Provider, MD  butalbital-acetaminophen-caffeine (FIORICET, ESGIC) 50-325-40 MG per tablet Take 1 tablet by mouth 2 (two) times daily as needed. For headache.   Yes Historical Provider, MD  furosemide (LASIX) 20 MG tablet Take 20 mg by mouth daily.   Yes Historical Provider, MD  guaiFENesin (MUCINEX) 600 MG 12 hr tablet Take 1 tablet (600 mg total) by mouth 2 (two) times daily. 01/04/12  Yes Laveda Norman, MD  pantoprazole (PROTONIX) 40 MG tablet Take 40 mg by mouth daily.   Yes Historical Provider, MD  potassium chloride (K-DUR,KLOR-CON) 10 MEQ tablet Take 10 mEq by mouth daily.   Yes Historical Provider, MD  predniSONE (DELTASONE) 10 MG tablet Take 40 mg daily for 3 days, then 30 mg daily for 3 days, then 20 mg daily for 3 days, then 10 mg daily for 3 days, then stop. 01/04/12  Yes Laveda Norman, MD  Sennosides-Docusate Sodium (STOOL SOFTENER & LAXATIVE PO) Take 1 capsule by mouth daily.   Yes Historical Provider, MD  levofloxacin (LEVAQUIN) 500 MG tablet Take 1 tablet (500 mg total) by mouth daily. 01/04/12   Laveda Norman, MD    Physical Exam: Filed Vitals:   06/19/12 1347 06/19/12 1351 06/19/12 1445 06/19/12 1540  BP:  130/58    Pulse:  97  105  Temp:  97.5 F (36.4 C)    TempSrc:  Oral    Resp:  23  20  SpO2: 100% 100% 100% 100%    Physical Exam  Constitutional: Appears elderly and frail, in mild distress due to shortness of breath and due to BiPAP HENT: Normocephalic. External right and left ear normal. Oropharynx is dry Eyes: Conjunctivae and EOM are normal. PERRLA, no scleral icterus.  Neck: Normal ROM. Neck supple. No JVD. No tracheal deviation. No thyromegaly.   CVS: RRR, S1/S2 +, no murmurs, no gallops, no carotid bruit.  Pulmonary: Diminished breath sounds bilaterally, wheezing on expiration, rhonchi at bases  Abdominal: Soft. BS +,  no distension, tenderness, rebound or guarding.  Musculoskeletal: Normal range of motion. No edema and no tenderness.  Lymphadenopathy: No lymphadenopathy noted, cervical, inguinal. Neuro: Alert. Normal reflexes, muscle tone coordination. No cranial nerve deficit. Skin: Skin is warm and dry. No rash noted. Not diaphoretic. No erythema. No pallor.  Psychiatric: Normal mood and affect. Behavior, judgment, thought content normal.   Labs on Admission:  Basic Metabolic Panel:  Recent Labs Lab 06/19/12 1500  NA 142  K 4.4  CL 94*  CO2 >45*  GLUCOSE 118*  BUN 16  CREATININE 0.45*  CALCIUM 9.2   CBC:  Recent Labs Lab 06/19/12 1500 06/19/12 1601  WBC 8.6 9.7  HGB 6.6* 6.9*  HCT 25.5* 28.1*  MCV 84.7 84.6  PLT 241 255   Radiological Exams on Admission: Dg Chest 1 View  06/19/2012  *RADIOLOGY REPORT*  Clinical Data: Shortness of breath  CHEST - 1 VIEW  Comparison: 02/10/2012  Findings:  Chronic interstitial markings/emphysematous changes with hyperinflation.  Patchy bibasilar opacities, atelectasis versus pneumonia.  Small pleural effusions are possible.  No frank interstitial edema.  Cardiomegaly.  IMPRESSION: Patchy bibasilar opacities, atelectasis versus pneumonia.  Possible small pleural effusions.  Cardiomegaly.  No frank interstitial edema.   Original Report Authenticated By: Charline Bills, M.D.    EKG: Normal sinus rhythm, no ST/T wave changes  Debbora Presto, MD  [facility] Pager [phone]  If 7PM-7AM, please contact night-coverage [email or web address] Password [facility] 06/19/2012, 4:41 PM

## 2012-06-19 NOTE — ED Notes (Signed)
Bed:WA09<BR> Expected date:<BR> Expected time:<BR> Means of arrival:<BR> Comments:<BR> SOB

## 2012-06-19 NOTE — ED Notes (Signed)
This RN had an unsuccessful IV attempt and could not find another vein to attempt.  Phlebotomist attempted to draw labs and could not find a vein.

## 2012-06-19 NOTE — ED Notes (Signed)
CO2 greater than 45 per lab.

## 2012-06-19 NOTE — ED Provider Notes (Signed)
History     CSN: [id number]  Arrival date & time 06/19/12  1342   First MD Initiated Contact with Patient 06/19/12 1401      Chief Complaint  Patient presents with  . Shortness of Breath    (Consider location/radiation/quality/duration/timing/severity/associated sxs/prior treatment) HPI Pt with increasing SOB x 2 days with increased cough and chills. No chest pain, fever. +mild bl lower ext swelling. Pt with history of COPD. She is on 4.5 L of oxygen at baseline Past Medical History  Diagnosis Date  . COPD (chronic obstructive pulmonary disease)   . Arthritis   . Emphysema   . GERD (gastroesophageal reflux disease)     Past Surgical History  Procedure Laterality Date  . Abdominal hysterectomy    . Breast surgery      left  . Back surgery      No family history on file.  History  Substance Use Topics  . Smoking status: Former Games developer  . Smokeless tobacco: Never Used  . Alcohol Use: No    OB History   Grav Para Term Preterm Abortions TAB SAB Ect Mult Living                  Review of Systems  Constitutional: Positive for chills. Negative for fever.  HENT: Negative for neck pain and neck stiffness.   Respiratory: Positive for cough, shortness of breath and wheezing. Negative for chest tightness.   Cardiovascular: Positive for leg swelling. Negative for chest pain and palpitations.  Gastrointestinal: Negative for nausea, vomiting and abdominal pain.  Musculoskeletal: Negative for myalgias and back pain.  Skin: Negative for rash and wound.  Neurological: Negative for dizziness, weakness, light-headedness, numbness and headaches.  All other systems reviewed and are negative.    Allergies  Codeine  Home Medications   Current Outpatient Rx  Name  Route  Sig  Dispense  Refill  . albuterol (PROVENTIL HFA;VENTOLIN HFA) 108 (90 BASE) MCG/ACT inhaler   Inhalation   Inhale 2 puffs into the lungs every 6 (six) hours as needed. For shortness of breath.          Marland Kitchen albuterol (PROVENTIL) (2.5 MG/3ML) 0.083% nebulizer solution   Nebulization   Take 2.5 mg by nebulization every 6 (six) hours as needed. For shortness of breath         . ALPRAZolam (XANAX) 1 MG tablet   Oral   Take 1 mg by mouth 3 (three) times daily as needed. For anxiety.         Marland Kitchen aspirin EC 81 MG tablet   Oral   Take 81 mg by mouth daily.         . butalbital-acetaminophen-caffeine (FIORICET, ESGIC) 50-325-40 MG per tablet   Oral   Take 1 tablet by mouth 2 (two) times daily as needed. For headache.         . furosemide (LASIX) 20 MG tablet   Oral   Take 20 mg by mouth daily.         Marland Kitchen guaiFENesin (MUCINEX) 600 MG 12 hr tablet   Oral   Take 1 tablet (600 mg total) by mouth 2 (two) times daily.   14 tablet   0   . pantoprazole (PROTONIX) 40 MG tablet   Oral   Take 40 mg by mouth daily.         . potassium chloride (K-DUR,KLOR-CON) 10 MEQ tablet   Oral   Take 10 mEq by mouth daily.         Marland Kitchen  predniSONE (DELTASONE) 10 MG tablet      Take 40 mg daily for 3 days, then 30 mg daily for 3 days, then 20 mg daily for 3 days, then 10 mg daily for 3 days, then stop.   30 tablet   0   . Sennosides-Docusate Sodium (STOOL SOFTENER & LAXATIVE PO)   Oral   Take 1 capsule by mouth daily.         Marland Kitchen levofloxacin (LEVAQUIN) 500 MG tablet   Oral   Take 1 tablet (500 mg total) by mouth daily.   7 tablet   0     BP 130/58  Pulse 105  Temp(Src) 97.5 F (36.4 C) (Oral)  Resp 20  SpO2 100%  Physical Exam  Nursing note and vitals reviewed. Constitutional: She is oriented to person, place, and time. She appears well-developed and well-nourished. No distress.  HENT:  Head: Normocephalic and atraumatic.  Mouth/Throat: Oropharynx is clear and moist.  Eyes: EOM are normal. Pupils are equal, round, and reactive to light.  Neck: Normal range of motion. Neck supple.  Cardiovascular: Normal rate and regular rhythm.   Pulmonary/Chest: She is in respiratory  distress. She has wheezes. She has no rales.  Increased work of breathing. Decreased air movement. Faint exp wheezes. Speaking in full sentences.   Abdominal: Soft. Bowel sounds are normal. She exhibits no distension and no mass. There is no tenderness. There is no rebound and no guarding.  Musculoskeletal: Normal range of motion. She exhibits edema (1+bl lower ext edema). She exhibits no tenderness.  Neurological: She is alert and oriented to person, place, and time.  Moves all ext without deficit  Skin: Skin is warm and dry. No rash noted. No erythema.  Psychiatric: She has a normal mood and affect. Her behavior is normal.    ED Course  Procedures (including critical care time)  Labs Reviewed  BASIC METABOLIC PANEL - Abnormal; Notable for the following:    Chloride 94 (*)    CO2 >45 (*)    Glucose, Bld 118 (*)    Creatinine, Ser 0.45 (*)    All other components within normal limits  CBC - Abnormal; Notable for the following:    RBC 3.01 (*)    Hemoglobin 6.6 (*)    HCT 25.5 (*)    MCH 21.9 (*)    MCHC 25.9 (*)    RDW 17.2 (*)    All other components within normal limits  BLOOD GAS, ARTERIAL - Abnormal; Notable for the following:    pH, Arterial 7.311 (*)    pCO2 arterial 88.7 (*)    pO2, Arterial 60.5 (*)    Bicarbonate 43.5 (*)    Acid-Base Excess 15.6 (*)    All other components within normal limits  CBC  POCT I-STAT TROPONIN I  TYPE AND SCREEN   Dg Chest 1 View  06/19/2012  *RADIOLOGY REPORT*  Clinical Data: Shortness of breath  CHEST - 1 VIEW  Comparison: 02/10/2012  Findings: Chronic interstitial markings/emphysematous changes with hyperinflation.  Patchy bibasilar opacities, atelectasis versus pneumonia.  Small pleural effusions are possible.  No frank interstitial edema.  Cardiomegaly.  IMPRESSION: Patchy bibasilar opacities, atelectasis versus pneumonia.  Possible small pleural effusions.  Cardiomegaly.  No frank interstitial edema.   Original Report Authenticated By:  Charline Bills, M.D.      1. COPD exacerbation   2. Community acquired pneumonia      Date: 06/19/2012  Rate: 98  Rhythm: normal sinus rhythm  QRS Axis:  normal  Intervals: normal  ST/T Wave abnormalities: normal  Conduction Disutrbances:none  Narrative Interpretation:   Old EKG Reviewed: none available Poor quality in I, II, and III   MDM  Pt with improved air movement. States she does not want to be intubated if respiratory status declines.   [facility] to admit.       Loren Racer, MD 06/19/12 1606

## 2012-06-19 NOTE — Plan of Care (Signed)
Problem: Consults Goal: Skin Care Protocol Initiated - if Braden Score 18 or less If consults are not indicated, leave blank or document N/A Outcome: Completed/Met Date Met:  06/19/12 Elevate heels, Q2hr turn

## 2012-06-19 NOTE — ED Notes (Signed)
Dr. Ranae Palms notified of critical lab per phone. No orders given at present.

## 2012-06-19 NOTE — ED Notes (Signed)
Respiratory at bedside.

## 2012-06-20 LAB — CBC
HCT: 29.4 % — ABNORMAL LOW (ref 36.0–46.0)
MCHC: 28.9 g/dL — ABNORMAL LOW (ref 30.0–36.0)
MCV: 83.3 fL (ref 78.0–100.0)
RDW: 16.4 % — ABNORMAL HIGH (ref 11.5–15.5)

## 2012-06-20 LAB — BASIC METABOLIC PANEL
BUN: 13 mg/dL (ref 6–23)
Chloride: 94 mEq/L — ABNORMAL LOW (ref 96–112)
Creatinine, Ser: 0.49 mg/dL — ABNORMAL LOW (ref 0.50–1.10)
GFR calc Af Amer: >90 mL/min (ref >90)

## 2012-06-20 LAB — LEGIONELLA ANTIGEN, URINE: Legionella Antigen, Urine: NEGATIVE

## 2012-06-20 LAB — STREP PNEUMONIAE URINARY ANTIGEN: Strep Pneumo Urinary Antigen: NEGATIVE

## 2012-06-20 MED ORDER — LEVOFLOXACIN IN D5W 750 MG/150ML IV SOLN
750.0000 mg | INTRAVENOUS | Status: DC
  Administered 2012-06-21: 750 mg via INTRAVENOUS
  Filled 2012-06-20: qty 150

## 2012-06-20 MED ORDER — PANTOPRAZOLE SODIUM 40 MG PO TBEC
40.0000 mg | DELAYED_RELEASE_TABLET | Freq: Two times a day (BID) | ORAL | Status: DC
  Administered 2012-06-21 – 2012-06-22 (×3): 40 mg via ORAL
  Filled 2012-06-20 (×6): qty 1

## 2012-06-20 MED ORDER — METHYLPREDNISOLONE SODIUM SUCC 125 MG IJ SOLR
80.0000 mg | Freq: Three times a day (TID) | INTRAMUSCULAR | Status: DC
  Administered 2012-06-20: 80 mg via INTRAVENOUS
  Administered 2012-06-21 (×2): 1.28 mg via INTRAVENOUS
  Filled 2012-06-20 (×6): qty 1.28

## 2012-06-20 MED ORDER — BUTALBITAL-APAP-CAFFEINE 50-325-40 MG PO TABS
1.0000 | ORAL_TABLET | Freq: Two times a day (BID) | ORAL | Status: DC | PRN
  Administered 2012-06-21 (×2): 1 via ORAL
  Filled 2012-06-20 (×3): qty 1

## 2012-06-20 MED ORDER — ALPRAZOLAM 0.5 MG PO TABS
0.5000 mg | ORAL_TABLET | Freq: Three times a day (TID) | ORAL | Status: DC | PRN
  Administered 2012-06-20 – 2012-06-22 (×6): 0.5 mg via ORAL
  Filled 2012-06-20 (×7): qty 1

## 2012-06-20 MED ORDER — ALPRAZOLAM 1 MG PO TABS
1.0000 mg | ORAL_TABLET | Freq: Three times a day (TID) | ORAL | Status: DC | PRN
  Administered 2012-06-20: 1 mg via ORAL
  Filled 2012-06-20: qty 1

## 2012-06-20 MED ORDER — VITAMINS A & D EX OINT
TOPICAL_OINTMENT | CUTANEOUS | Status: AC
  Administered 2012-06-20: 1
  Filled 2012-06-20: qty 5

## 2012-06-20 MED ORDER — GUAIFENESIN ER 600 MG PO TB12
600.0000 mg | ORAL_TABLET | Freq: Two times a day (BID) | ORAL | Status: DC
  Administered 2012-06-20 – 2012-06-22 (×5): 600 mg via ORAL
  Filled 2012-06-20 (×6): qty 1

## 2012-06-20 NOTE — Progress Notes (Signed)
BIPAP not required at this time.

## 2012-06-20 NOTE — Progress Notes (Signed)
ANTIBIOTIC CONSULT NOTE - INITIAL  Pharmacy Consult for antibiotic renal dose adjustment - Levaquin Indication: pneumonia and AECOPD   Labs:  Recent Labs  06/19/12 1500 06/19/12 1601 06/20/12 0545  WBC 8.6 9.7 5.0  HGB 6.6* 6.9* 8.5*  PLT 241 255 192  CREATININE 0.45*  --  0.49*   Estimated Creatinine Clearance: 42.3 ml/min (by C-G formula based on Cr of 0.49). No results found for this basename: VANCOTROUGH, VANCOPEAK, VANCORANDOM, GENTTROUGH, GENTPEAK, GENTRANDOM, TOBRATROUGH, TOBRAPEAK, TOBRARND, AMIKACINPEAK, AMIKACINTROU, AMIKACIN,  in the last 72 hours    A/P: Currently estimated CrCl is 42 ml/min - will change Levaquin from 750mg  IV q24 to 750mg  IV q48. Will continue to monitor renal function   Hessie Knows, PharmD, BCPS Pager [phone] 06/20/2012 12:53 PM

## 2012-06-20 NOTE — Progress Notes (Signed)
Patient ID: Dana Hess, female   DOB: 02/18/1934, 79 y.o.   MRN: [id number] [facility] PROGRESS NOTE  FAYRENE TOWNER HYQ:[id number] DOB: [birth year] DOA: 06/19/2012 PCP: [name], MD  Brief narrative: Pt is 79 yo female with COPD/Emphysema who presented to [facility] ED with main concern of progressively worsening shortness of breath that initially started several days prior to admission, present mostly with exertion initially but now present at rest as well, associated with subjective fevers, chills, cough productive of yellow sputum, poor oral intake. No specific alleviating or aggravating factors, no abdominal or urinary concerns, no chest pain. Pt reports similar events in the past but not of the same intensity.   In ED, pt with oxygen saturations in 80's on BiPAP, uncomfortable, CXR indicative of PNA. Pt expressed that she wants to have code status DNR, family at bedside agreed. [facility] asked to admit for further management of COPD, PNA.   Assessment and Plan:  Principal Problem:  Acute and chronic respiratory failure  - likely secondary to acute on chronic COPD exacerbation with PNA - pt clinically improving and maintaining oxygen saturations at target range on NRB mask - continue supportive care with nebulizers scheduled and as needed, solumedrol, empiric ABX  - sputum culture and gram stain, urine legionella and strep pneumo pending to date  - pt DNR and expressed desire to be comfortable, no intubation or CPR  Active Problems:  Anemia of chronic disease  - somewhat lower Hg on admission, compared to her baseline 8-9  - transfused 2 units of PRBC 06/19/2012, appropriate rise in Hg - CBC in AM, hold heparin products   Consultants:  None  Procedures/Studies: Dg Chest 1 View 06/19/2012   Patchy bibasilar opacities, atelectasis versus pneumonia.  Possible small pleural effusions.  Cardiomegaly.  No frank interstitial edema.    Antibiotics:  Levaquin 05/03 -->  Code Status:  DNR Family Communication: Pt and family at bedside Disposition Plan: Home when medically stable  HPI/Subjective: No events overnight.   Objective: Filed Vitals:   06/20/12 0609 06/20/12 0744 06/20/12 0800 06/20/12 0815  BP: 124/58   111/72  Pulse: 94   92  Temp:   98.3 F (36.8 C)   TempSrc:   Axillary   Resp: 19   25  Height:      Weight:      SpO2: 100% 100%  100%    Intake/Output Summary (Last 24 hours) at 06/20/12 1113 Last data filed at 06/20/12 0900  Gross per 24 hour  Intake    900 ml  Output    750 ml  Net    150 ml    Exam:   General:  Pt is alert, follows commands appropriately, not in acute distress  Cardiovascular: Regular rate and rhythm, S1/S2, no murmurs, no rubs, no gallops  Respiratory: Bilateral rhonchi, expiratory wheezing, on NRB  Abdomen: Soft, non tender, non distended, bowel sounds present, no guarding  Extremities: No edema, pulses DP and PT palpable bilaterally  Neuro: Grossly nonfocal  Data Reviewed: Basic Metabolic Panel:  Recent Labs Lab 06/19/12 1500 06/20/12 0545  NA 142 141  K 4.4 4.5  CL 94* 94*  CO2 >45* 43*  GLUCOSE 118* 120*  BUN 16 13  CREATININE 0.45* 0.49*  CALCIUM 9.2 8.8   CBC:  Recent Labs Lab 06/19/12 1500 06/19/12 1601 06/20/12 0545  WBC 8.6 9.7 5.0  HGB 6.6* 6.9* 8.5*  HCT 25.5* 28.1* 29.4*  MCV 84.7 84.6 83.3  PLT 241 255 192  Recent Results (from the past 240 hour(s))  MRSA PCR SCREENING     Status: None   Collection Time    06/19/12  5:39 PM      Result Value Range Status   MRSA by PCR NEGATIVE  NEGATIVE Final   Comment:            The GeneXpert MRSA Assay (FDA     approved for NASAL specimens     only), is one component of a     comprehensive MRSA colonization     surveillance program. It is not     intended to diagnose MRSA     infection nor to guide or     monitor treatment for     MRSA infections.     Scheduled Meds: . albuterol  2.5 mg Nebulization QID  . aspirin EC  81 mg  Oral Daily  . furosemide  20 mg Oral Daily  . levofloxacin (LEVAQUIN) IV  750 mg Intravenous Q24H  . methylPREDNISolone (SOLU-MEDROL) injection  125 mg Intravenous Q6H  . pantoprazole  40 mg Oral Daily  . potassium chloride  10 mEq Oral Daily  . sodium chloride  3 mL Intravenous Q12H   Continuous Infusions:  Debbora Presto, MD  [facility] Pager [phone]  If 7PM-7AM, please contact night-coverage [email or web address] Password [email or web address] 06/20/2012, 11:13 AM   LOS: 1 day

## 2012-06-20 NOTE — Plan of Care (Signed)
Problem: Phase I Progression Outcomes Goal: Tolerating diet Outcome: Progressing Poor appetite.     

## 2012-06-21 LAB — BASIC METABOLIC PANEL
BUN: 22 mg/dL (ref 6–23)
CO2: >45 mEq/L (ref 19–32)
Chloride: 92 mEq/L — ABNORMAL LOW (ref 96–112)
Creatinine, Ser: 0.53 mg/dL (ref 0.50–1.10)
Glucose, Bld: 136 mg/dL — ABNORMAL HIGH (ref 70–99)

## 2012-06-21 LAB — TYPE AND SCREEN
Antibody Screen: NEGATIVE
Unit division: 0

## 2012-06-21 LAB — CBC
HCT: 33.6 % — ABNORMAL LOW (ref 36.0–46.0)
MCV: 85.9 fL (ref 78.0–100.0)
RBC: 3.91 MIL/uL (ref 3.87–5.11)
WBC: 7.8 10*3/uL (ref 4.0–10.5)

## 2012-06-21 MED ORDER — PREDNISONE 50 MG PO TABS
50.0000 mg | ORAL_TABLET | Freq: Every day | ORAL | Status: DC
  Administered 2012-06-21 – 2012-06-22 (×2): 50 mg via ORAL
  Filled 2012-06-21 (×3): qty 1

## 2012-06-21 NOTE — Progress Notes (Signed)
Patient ID: Dana Hess, female   DOB: [birth year], 79 y.o.   MRN: [id number]  [facility] PROGRESS NOTE  Dana Hess [id number]:[id number] DOB: 1933/08/08 DOA: 06/19/2012 PCP: [name], MD  Brief narrative:  Pt is 79 yo female with COPD/Emphysema who presented to [facility] ED with main concern of progressively worsening shortness of breath that initially started several days prior to admission, present mostly with exertion initially but now present at rest as well, associated with subjective fevers, chills, cough productive of yellow sputum, poor oral intake. No specific alleviating or aggravating factors, no abdominal or urinary concerns, no chest pain. Pt reports similar events in the past but not of the same intensity.   In ED, pt with oxygen saturations in 80's on BiPAP, uncomfortable, CXR indicative of PNA. Pt expressed that she wants to have code status DNR, family at bedside agreed. [facility] asked to admit for further management of COPD, PNA.   Assessment and Plan:  Principal Problem:  Acute and chronic respiratory failure  - likely secondary to acute on chronic COPD exacerbation with PNA - pt clinically improving and maintaining oxygen saturations at target range on [place] 2 L - continue supportive care with nebulizers scheduled and as needed, discontinue solumedrol and transition to Prednisone  - sputum culture and gram stain, urine legionella and strep pneumo negative to date - pt DNR and expressed desire to be comfortable, no intubation or CPR  Active Problems:  Anemia of chronic disease  - somewhat lower Hg on admission, compared to her baseline 8-9  - transfused 2 units of PRBC 06/19/2012, appropriate rise in Hg  - Hg remains stable over 24 hours  Consultants:  None Procedures/Studies:  Dg Chest 1 View 06/19/2012  Patchy bibasilar opacities, atelectasis versus pneumonia. Possible small pleural effusions. Cardiomegaly. No frank interstitial edema.  Antibiotics:  Levaquin 05/03  --> Code Status: DNR  Family Communication: Pt and family at bedside  Disposition Plan: Home when medically stable   HPI/Subjective: No events overnight.   Objective: Filed Vitals:   06/21/12 0400 06/21/12 0800 06/21/12 0838 06/21/12 0920  BP: 118/60 118/64    Pulse:  90  114  Temp: 98.1 F (36.7 C) 96.9 F (36.1 C)    TempSrc: Axillary Axillary    Resp:  28  27  Height:      Weight:      SpO2:  91% 93% 95%    Intake/Output Summary (Last 24 hours) at 06/21/12 1205 Last data filed at 06/21/12 1000  Gross per 24 hour  Intake    670 ml  Output    650 ml  Net     20 ml    Exam:   General:  Pt is alert, follows commands appropriately, not in acute distress  Cardiovascular: Regular rate and rhythm, S1/S2, no murmurs, no rubs, no gallops  Respiratory: Clear to auscultation bilaterally, rhonchi still present, decreased breath sounds at bases  Abdomen: Soft, non tender, non distended, bowel sounds present, no guarding  Extremities: No edema, pulses DP and PT palpable bilaterally  Neuro: Grossly nonfocal  Data Reviewed: Basic Metabolic Panel:  Recent Labs Lab 06/19/12 1500 06/20/12 0545 06/21/12 0349  NA 142 141 140  K 4.4 4.5 4.4  CL 94* 94* 92*  CO2 >45* 43* >45*  GLUCOSE 118* 120* 136*  BUN 16 13 22   CREATININE 0.45* 0.49* 0.53  CALCIUM 9.2 8.8 8.9   CBC:  Recent Labs Lab 06/19/12 1500 06/19/12 1601 06/20/12 0545 06/21/12 0349  WBC  8.6 9.7 5.0 7.8  HGB 6.6* 6.9* 8.5* 9.1*  HCT 25.5* 28.1* 29.4* 33.6*  MCV 84.7 84.6 83.3 85.9  PLT 241 255 192 188   Recent Results (from the past 240 hour(s))  CULTURE, BLOOD (ROUTINE X 2)     Status: None   Collection Time    06/19/12  5:03 PM      Result Value Range Status   Specimen Description BLOOD LEFT ARM   Final   Special Requests BOTTLES DRAWN AEROBIC AND ANAEROBIC   Final   Culture  Setup Time 06/20/2012 01:19   Final   Culture     Final   Value:        BLOOD CULTURE RECEIVED NO GROWTH TO DATE  CULTURE WILL BE HELD FOR 5 DAYS BEFORE ISSUING A FINAL NEGATIVE REPORT   Report Status PENDING   Incomplete  CULTURE, BLOOD (ROUTINE X 2)     Status: None   Collection Time    06/19/12  5:11 PM      Result Value Range Status   Specimen Description BLOOD LEFT HAND   Final   Special Requests BOTTLES DRAWN AEROBIC AND ANAEROBIC   Final   Culture  Setup Time 06/20/2012 01:19   Final   Culture     Final   Value:        BLOOD CULTURE RECEIVED NO GROWTH TO DATE CULTURE WILL BE HELD FOR 5 DAYS BEFORE ISSUING A FINAL NEGATIVE REPORT   Report Status PENDING   Incomplete  MRSA PCR SCREENING     Status: None   Collection Time    06/19/12  5:39 PM      Result Value Range Status   MRSA by PCR NEGATIVE  NEGATIVE Final   Comment:            The GeneXpert MRSA Assay (FDA     approved for NASAL specimens     only), is one component of a     comprehensive MRSA colonization     surveillance program. It is not     intended to diagnose MRSA     infection nor to guide or     monitor treatment for     MRSA infections.     Scheduled Meds: . albuterol  2.5 mg Nebulization QID  . aspirin EC  81 mg Oral Daily  . furosemide  20 mg Oral Daily  . guaiFENesin  600 mg Oral BID  . levofloxacin (LEVAQUIN) IV  750 mg Intravenous Q48H  . methylPREDNISolone (SOLU-MEDROL) injection  80 mg Intravenous Q8H  . pantoprazole  40 mg Oral BID  . potassium chloride  10 mEq Oral Daily  . sodium chloride  3 mL Intravenous Q12H   Continuous Infusions:  Debbora Presto, MD  [facility] Pager [phone]  If 7PM-7AM, please contact night-coverage [email or web address] Password [email or web address] 06/21/2012, 12:05 PM   LOS: 2 days

## 2012-06-21 NOTE — Progress Notes (Signed)
Pt's C02 this am is greater than 45. Lenny Pastel NP made aware. The Pt is alert x 4 v/s stable, no orders given will monitor.

## 2012-06-21 NOTE — Evaluation (Signed)
Physical Therapy Evaluation Patient Details Name: Dana Hess MRN: [id number] DOB: 1934-01-19 Today's Date: 06/21/2012 Time: 3474-2595 PT Time Calculation (min): 30 min  PT Assessment / Plan / Recommendation Clinical Impression  79 y.o. female admitted with COPD exacerbation, PNA. Pt used 4L O2 at home, is now on 6L O2. SaO2 92% at rest on 6L, 83% after bed to chair transfer using 6L O2. 88% after 5 min rest. Pt has 24* assist at home and appears to be nearly at baseline with mobility. HHPT recommended.     PT Assessment  Patient needs continued PT services    Follow Up Recommendations  Home health PT    Does the patient have the potential to tolerate intense rehabilitation      Barriers to Discharge None      Equipment Recommendations  None recommended by PT    Recommendations for Other Services     Frequency Min 3X/week    Precautions / Restrictions Precautions Precaution Comments: monitor O2 sats Restrictions Weight Bearing Restrictions: No   Pertinent Vitals/Pain *SaO2 92 % at rest on 6L O2, 83% after bed to chair transfer on 6L O2, 88% after 5 min rest Pt denied pain**      Mobility  Bed Mobility Bed Mobility: Supine to Sit;Rolling Left Rolling Left: 3: Mod assist;With rail Supine to Sit: 3: Mod assist;With rails Details for Bed Mobility Assistance: mod assist to initiate movement, to elevate trunk Transfers Transfers: Sit to Stand;Stand to Sit;Stand Pivot Transfers Sit to Stand: 4: Min assist;From bed Stand to Sit: 4: Min assist;To chair/3-in-1 Stand Pivot Transfers: 4: Min assist Details for Transfer Assistance: min A for balance, pt fatigues quickly Ambulation/Gait Ambulation/Gait Assistance: Not tested (comment)    Exercises     PT Diagnosis: Difficulty walking  PT Problem List: Decreased activity tolerance;Cardiopulmonary status limiting activity;Decreased mobility PT Treatment Interventions: Gait training;Functional mobility  training;Therapeutic exercise;Therapeutic activities   PT Goals Acute Rehab PT Goals PT Goal Formulation: With patient/family Time For Goal Achievement: 07/05/12 Potential to Achieve Goals: Fair Pt will go Supine/Side to Sit: with min assist PT Goal: Supine/Side to Sit - Progress: Goal set today Pt will go Sit to Stand: with min assist PT Goal: Sit to Stand - Progress: Goal set today Pt will Transfer Bed to Chair/Chair to Bed: with min assist PT Transfer Goal: Bed to Chair/Chair to Bed - Progress: Goal set today Pt will Ambulate: 1 - 15 feet;with rolling walker;with min assist PT Goal: Ambulate - Progress: Goal set today  Visit Information  Last PT Received On: 06/21/12 Assistance Needed: +1    Subjective Data  Subjective: I need suction.  Patient Stated Goal: to walk   Prior Functioning  Home Living Lives With: Daughter Available Help at Discharge: Family;Available 24 hours/day Type of Home: House Home Access: Stairs to enter [employer] of Steps: 1 Home Layout: One level Bathroom Shower/Tub: Engineer, manufacturing systems: Standard Home Adaptive Equipment: Shower chair with back;Walker - four wheeled;Wheelchair - manual;Bedside commode/3-in-1 Prior Function Level of Independence: Needs assistance Needs Assistance: Bathing;Dressing;Toileting;Gait;Light Housekeeping;Meal Prep;Transfers Gait Assistance: pt walked short household distances with RW and assist Driving: No Communication Communication: Expressive difficulties (no teeth, speech difficult to understand)    Cognition  Cognition Arousal/Alertness: Awake/alert Behavior During Therapy: WFL for tasks assessed/performed Overall Cognitive Status: Within Functional Limits for tasks assessed    Extremity/Trunk Assessment Right Upper Extremity Assessment RUE ROM/Strength/Tone: [facility] for tasks assessed Left Upper Extremity Assessment LUE ROM/Strength/Tone: [facility] for tasks assessed Right Lower Extremity  Assessment RLE ROM/Strength/Tone: WFL for tasks assessed RLE Sensation: WFL - Light Touch RLE Coordination: WFL - gross/fine motor Left Lower Extremity Assessment LLE ROM/Strength/Tone: WFL for tasks assessed LLE Sensation: WFL - Light Touch LLE Coordination: WFL - gross/fine motor Trunk Assessment Trunk Assessment: Kyphotic   Balance Balance Balance Assessed: Yes Static Sitting Balance Static Sitting - Balance Support: Bilateral upper extremity supported;Feet supported Static Sitting - Level of Assistance: 5: Stand by assistance Static Sitting - Comment/# of Minutes: 3  End of Session PT - End of Session Equipment Utilized During Treatment: Gait belt Activity Tolerance: Patient limited by fatigue Patient left: in chair;with call bell/phone within reach Nurse Communication: Mobility status  GP     Ralene Bathe Kistler 06/21/2012, 1:25 PM  [phone]

## 2012-06-21 NOTE — Progress Notes (Signed)
Called to Patient's room by daughter. Daughter has small portable finger O2 sat monitor (from home) on patient & O2 sat reading in 70s & 80s. Patient calm without labored respirations. Good color. Asymptomatic for low blood oxygenation. Daughter became increasingly anxious & almost agitated seeing her O2 monitor show low O2 sats. Oxygen level checked with our dynamap & level was 94 - 96%. Discussed w/daughter that her monitor may need to be checked/calibrated. Also education done w/daughter & patient about signs & symptoms of low blood oxygen & need to look at whole picture. Voiced understanding. Aplogetic for earlier anxiety. Support & encouragement given. Will continue to monitor & provide support to pt & family.Hartley Barefoot

## 2012-06-22 LAB — CBC
HCT: 31.3 % — ABNORMAL LOW (ref 36.0–46.0)
MCHC: 26.8 g/dL — ABNORMAL LOW (ref 30.0–36.0)
MCV: 85.8 fL (ref 78.0–100.0)
RDW: 17.4 % — ABNORMAL HIGH (ref 11.5–15.5)

## 2012-06-22 LAB — BASIC METABOLIC PANEL
BUN: 29 mg/dL — ABNORMAL HIGH (ref 6–23)
Chloride: 91 mEq/L — ABNORMAL LOW (ref 96–112)
Creatinine, Ser: 0.59 mg/dL (ref 0.50–1.10)
GFR calc Af Amer: >90 mL/min (ref >90)
GFR calc non Af Amer: 86 mL/min — ABNORMAL LOW (ref >90)

## 2012-06-22 MED ORDER — PREDNISONE 50 MG PO TABS: ORAL_TABLET | ORAL | Status: DC

## 2012-06-22 MED ORDER — LEVOFLOXACIN 500 MG PO TABS: 500.0000 mg | ORAL_TABLET | Freq: Every day | ORAL | Status: DC

## 2012-06-22 NOTE — Care Management Note (Signed)
Cm spoke with patient concerning discharge planning. PT eval recommends HHPT. Pt does not request HH services at this time. Per pt two adult daughters live with her full time whom assist with home care. Pt from home with home oxygen therapy. Pt's daughter to bring portable oxygen tank to bedside prior to discharge. No other needs assessed.   Roxy Manns Deng Kemler,RN,BSN [phone]

## 2012-06-22 NOTE — Discharge Summary (Signed)
Physician Discharge Summary  Dana Hess RUE:[id number] DOB: July 26, 1933 DOA: 06/19/2012  PCP: [name], MD  Admit date: 06/19/2012 Discharge date: 06/22/2012  Recommendations for Outpatient Follow-up:  1. Pt will need to follow up with PCP in 2-3 weeks post discharge 2. Please obtain BMP to evaluate electrolytes and kidney function 3. Please also check CBC to evaluate Hg and Hct levels 4. Please note that pt was discharged on Levaquin to complete the therapy for 5 more days post discharge 5. Pt also discharged on Prednisone taper pack over the next 5 days post discharge   Discharge Diagnoses:   Acute and chronic respiratory failure secondary to COPD exacerbation, CAP Active Problems:   Anemia of chronic disease  Discharge Condition: Stable  Diet recommendation: Heart healthy diet discussed in details   Brief narrative:  Pt is 79 yo female with COPD/Emphysema who presented to [facility] ED with main concern of progressively worsening shortness of breath that initially started several days prior to admission, present mostly with exertion initially but now present at rest as well, associated with subjective fevers, chills, cough productive of yellow sputum, poor oral intake. No specific alleviating or aggravating factors, no abdominal or urinary concerns, no chest pain. Pt reports similar events in the past but not of the same intensity.   In ED, pt with oxygen saturations in 80's on BiPAP, uncomfortable, CXR indicative of PNA. Pt expressed that she wants to have code status DNR, family at bedside agreed. [facility] asked to admit for further management of COPD, PNA.   Assessment and Plan:  Principal Problem:  Acute and chronic respiratory failure  - likely secondary to acute on chronic COPD exacerbation with PNA - pt clinically improving and maintaining oxygen saturations at target range on  2 L  - please note that pt is on oxygen 4.5 L at home - continued supportive care with nebulizers  scheduled and as needed, prednisone taper, pt has responded well  - sputum culture and gram stain, urine legionella and strep pneumo negative  - pt DNR and expressed desire to be comfortable, no intubation or CPR  Active Problems:  Anemia of chronic disease  - somewhat lower Hg on admission, compared to her baseline 8-9  - transfused 2 units of PRBC 06/19/2012, appropriate rise in Hg  - Hg remains stable over 48 hours   Consultants:  None Procedures/Studies:  Dg Chest 1 View 06/19/2012  Patchy bibasilar opacities, atelectasis versus pneumonia. Possible small pleural effusions. Cardiomegaly. No frank interstitial edema.  Antibiotics:  Levaquin 05/03 --> 06/27/2012  Code Status: DNR  Family Communication: Pt and family at bedside   Discharge Exam: Filed Vitals:   06/22/12 0529  BP: 138/64  Pulse: 97  Temp: 98.3 F (36.8 C)  Resp: 16   Filed Vitals:   06/21/12 1954 06/21/12 2250 06/22/12 0529 06/22/12 0842  BP:  139/55 138/64   Pulse:  94 97   Temp:  98.2 F (36.8 C) 98.3 F (36.8 C)   TempSrc:  Oral Oral   Resp:  18 16   Height:      Weight:      SpO2: 90% 100% 95% 91%    General: Pt is alert, follows commands appropriately, not in acute distress Cardiovascular: Regular rate and rhythm, S1/S2 +, no murmurs, no rubs, no gallops Respiratory: Clear to auscultation bilaterally, no wheezing, decreased breath sounds at bases Abdominal: Soft, non tender, non distended, bowel sounds +, no guarding Extremities: no edema, no cyanosis, pulses palpable bilaterally DP  and PT Neuro: Grossly nonfocal  Discharge Instructions  Discharge Orders   Future Orders Complete By Expires     Diet - low sodium heart healthy  As directed     Increase activity slowly  As directed         Medication List    TAKE these medications       albuterol 108 (90 BASE) MCG/ACT inhaler  Commonly known as:  PROVENTIL HFA;VENTOLIN HFA  Inhale 2 puffs into the lungs every 6 (six) hours as needed. For  shortness of breath.     albuterol (2.5 MG/3ML) 0.083% nebulizer solution  Commonly known as:  PROVENTIL  Take 2.5 mg by nebulization every 6 (six) hours as needed. For shortness of breath     ALPRAZolam 1 MG tablet  Commonly known as:  XANAX  Take 1 mg by mouth 3 (three) times daily as needed. For anxiety.     aspirin EC 81 MG tablet  Take 81 mg by mouth daily.     butalbital-acetaminophen-caffeine 50-325-40 MG per tablet  Commonly known as:  FIORICET, ESGIC  Take 1 tablet by mouth 2 (two) times daily as needed. For headache.     furosemide 20 MG tablet  Commonly known as:  LASIX  Take 20 mg by mouth daily.     guaiFENesin 600 MG 12 hr tablet  Commonly known as:  MUCINEX  Take 1 tablet (600 mg total) by mouth 2 (two) times daily.     levofloxacin 500 MG tablet  Commonly known as:  LEVAQUIN  Take 1 tablet (500 mg total) by mouth daily.     potassium chloride 10 MEQ tablet  Commonly known as:  K-DUR,KLOR-CON  Take 10 mEq by mouth daily.     predniSONE 50 MG tablet  Commonly known as:  DELTASONE  Take 50 mg tablet today and taper down by 10 mg daily until completed     PROTONIX 40 MG tablet  Generic drug:  pantoprazole  Take 40 mg by mouth daily.     STOOL SOFTENER & LAXATIVE PO  Take 1 capsule by mouth daily.           Follow-up Information   Follow up with [name], MD In 1 week.   Contact information:   [address] [place] [address] [phone]        The results of significant diagnostics from this hospitalization (including imaging, microbiology, ancillary and laboratory) are listed below for reference.     Microbiology: Recent Results (from the past 240 hour(s))  CULTURE, BLOOD (ROUTINE X 2)     Status: None   Collection Time    06/19/12  5:03 PM      Result Value Range Status   Specimen Description BLOOD LEFT ARM   Final   Special Requests BOTTLES DRAWN AEROBIC AND ANAEROBIC   Final   Culture  Setup Time 06/20/2012  01:19   Final   Culture     Final   Value:        BLOOD CULTURE RECEIVED NO GROWTH TO DATE CULTURE WILL BE HELD FOR 5 DAYS BEFORE ISSUING A FINAL NEGATIVE REPORT   Report Status PENDING   Incomplete  CULTURE, BLOOD (ROUTINE X 2)     Status: None   Collection Time    06/19/12  5:11 PM      Result Value Range Status   Specimen Description BLOOD LEFT HAND   Final   Special Requests BOTTLES DRAWN  AEROBIC AND ANAEROBIC   Final   Culture  Setup Time 06/20/2012 01:19   Final   Culture     Final   Value:        BLOOD CULTURE RECEIVED NO GROWTH TO DATE CULTURE WILL BE HELD FOR 5 DAYS BEFORE ISSUING A FINAL NEGATIVE REPORT   Report Status PENDING   Incomplete  MRSA PCR SCREENING     Status: None   Collection Time    06/19/12  5:39 PM      Result Value Range Status   MRSA by PCR NEGATIVE  NEGATIVE Final   Comment:            The GeneXpert MRSA Assay (FDA     approved for NASAL specimens     only), is one component of a     comprehensive MRSA colonization     surveillance program. It is not     intended to diagnose MRSA     infection nor to guide or     monitor treatment for     MRSA infections.     Labs: Basic Metabolic Panel:  Recent Labs Lab 06/19/12 1500 06/20/12 0545 06/21/12 0349 06/22/12 0401  NA 142 141 140 142  K 4.4 4.5 4.4 3.9  CL 94* 94* 92* 91*  CO2 >45* 43* >45* >45*  GLUCOSE 118* 120* 136* 77  BUN 16 13 22  29*  CREATININE 0.45* 0.49* 0.53 0.59  CALCIUM 9.2 8.8 8.9 8.7   CBC:  Recent Labs Lab 06/19/12 1500 06/19/12 1601 06/20/12 0545 06/21/12 0349 06/22/12 0401  WBC 8.6 9.7 5.0 7.8 12.0*  HGB 6.6* 6.9* 8.5* 9.1* 8.4*  HCT 25.5* 28.1* 29.4* 33.6* 31.3*  MCV 84.7 84.6 83.3 85.9 85.8  PLT 241 255 192 188 199    SIGNED: Time coordinating discharge: Over 30 minutes  Debbora Presto, MD  [facility] 06/22/2012, 9:58 AM Pager [phone]  If 7PM-7AM, please contact night-coverage [email or web address] Password [email or web address]

## 2012-06-22 NOTE — Progress Notes (Signed)
Patient discharged home, all discharge medications and instructions reviewed and questions answered. Patient to be assisted to vehicle by wheelchair.  

## 2012-06-26 LAB — CULTURE, BLOOD (ROUTINE X 2): Culture: NO GROWTH

## 2012-07-29 ENCOUNTER — Telehealth: Payer: Self-pay | Admitting: Internal Medicine

## 2012-07-30 NOTE — Telephone Encounter (Signed)
Patient with advanced lung disease, not seen here in several years. Dr Foy [place] asked to discuss nebulized Mucomyst as a mucolytic, and her normocytic anemia.

## 2012-07-31 NOTE — Telephone Encounter (Signed)
Will close message at this time as Dr. Maple Hudson and Dr. Foy Gu[place]ara have spoken with eachother

## 2012-12-09 DIAGNOSIS — K219 Gastro-esophageal reflux disease without esophagitis: Secondary | ICD-10-CM | POA: Insufficient documentation

## 2012-12-09 DIAGNOSIS — Z7982 Long term (current) use of aspirin: Secondary | ICD-10-CM | POA: Insufficient documentation

## 2012-12-09 DIAGNOSIS — Z79899 Other long term (current) drug therapy: Secondary | ICD-10-CM | POA: Insufficient documentation

## 2012-12-09 DIAGNOSIS — Z87891 Personal history of nicotine dependence: Secondary | ICD-10-CM | POA: Insufficient documentation

## 2012-12-09 DIAGNOSIS — J449 Chronic obstructive pulmonary disease, unspecified: Secondary | ICD-10-CM | POA: Insufficient documentation

## 2012-12-09 DIAGNOSIS — J4489 Other specified chronic obstructive pulmonary disease: Secondary | ICD-10-CM | POA: Insufficient documentation

## 2012-12-09 DIAGNOSIS — Z8701 Personal history of pneumonia (recurrent): Secondary | ICD-10-CM | POA: Insufficient documentation

## 2012-12-09 DIAGNOSIS — R040 Epistaxis: Secondary | ICD-10-CM | POA: Insufficient documentation

## 2012-12-09 DIAGNOSIS — M129 Arthropathy, unspecified: Secondary | ICD-10-CM | POA: Insufficient documentation

## 2012-12-09 DIAGNOSIS — IMO0002 Reserved for concepts with insufficient information to code with codable children: Secondary | ICD-10-CM | POA: Insufficient documentation

## 2012-12-10 ENCOUNTER — Emergency Department (HOSPITAL_COMMUNITY)
Admission: EM | Admit: 2012-12-10 | Discharge: 2012-12-10 | Disposition: A | Discharge status: Home / Self Care | Payer: Medicare Other | Attending: Emergency Medicine | Admitting: Emergency Medicine

## 2012-12-10 ENCOUNTER — Encounter (HOSPITAL_COMMUNITY): Payer: Self-pay | Admitting: Emergency Medicine

## 2012-12-10 DIAGNOSIS — R040 Epistaxis: Secondary | ICD-10-CM

## 2012-12-10 NOTE — ED Notes (Signed)
MD at bedside at this time speaking with patient

## 2012-12-10 NOTE — ED Notes (Signed)
Per EMS, pt has had a nosebleed for the past hour, used Afrin at home, uses oxygen at home 5L, EMS initiated humidified oxygen for pt. Pt denies nausea at this time. Bleeding controlled with direct pressure en route.

## 2012-12-10 NOTE — ED Notes (Signed)
Bed: WU98 Expected date:  Expected time:  Means of arrival:  Comments: EMS 79yo Nosebleed

## 2012-12-10 NOTE — ED Provider Notes (Signed)
CSN: [id number]     Arrival date & time 12/09/12  2359 History   First MD Initiated Contact with Patient 12/10/12 0003     Chief Complaint  Patient presents with  . Epistaxis   (Consider location/radiation/quality/duration/timing/severity/associated sxs/prior Treatment) HPI 79 yo female presents to the ER from home via EMS with reported nosebleed.  Pt reports she was lying in a recliner when she felt fluid going down the back of her throat.  She initially thought that her nose was running, but then found blood when she blew her nose.  She reports similar presentation about a year ago requiring rhinorocket.  When seen by ENT about a week later, no further intervention needed.  Pt's daughter reports she has hole in septum due to chronic 02 usage.  Pt with h/o COPD, on 5 liters [place].  Past Medical History  Diagnosis Date  . COPD (chronic obstructive pulmonary disease)   . Arthritis   . Emphysema   . GERD (gastroesophageal reflux disease)   . Pneumonia 06/19/2012   Past Surgical History  Procedure Laterality Date  . Abdominal hysterectomy    . Breast surgery      left  . Back surgery     History reviewed. No pertinent family history. History  Substance Use Topics  . Smoking status: Former Smoker -- 30 years    Quit date: 06/20/1995  . Smokeless tobacco: Never Used  . Alcohol Use: No   OB History   Grav Para Term Preterm Abortions TAB SAB Ect Mult Living                 Review of Systems  All other systems reviewed and are negative.  other than listed in hpi  Allergies  Codeine  Home Medications   Current Outpatient Rx  Name  Route  Sig  Dispense  Refill  . albuterol (PROVENTIL) (2.5 MG/3ML) 0.083% nebulizer solution   Nebulization   Take 2.5 mg by nebulization every 6 (six) hours as needed. For shortness of breath         . ALPRAZolam (XANAX) 1 MG tablet   Oral   Take 0.5-1 mg by mouth 3 (three) times daily as needed for anxiety. Take 0.5 tablet twice a day  Take  1 tablet at bedtime         . aspirin EC 81 MG tablet   Oral   Take 81 mg by mouth daily.         . butalbital-acetaminophen-caffeine (FIORICET, ESGIC) 50-325-40 MG per tablet   Oral   Take 1 tablet by mouth at bedtime. For headache.         . cholecalciferol (VITAMIN D) 1000 UNITS tablet   Oral   Take 1,000 Units by mouth daily.         . ferrous sulfate 325 (65 FE) MG tablet   Oral   Take 325 mg by mouth 2 (two) times daily.         . furosemide (LASIX) 20 MG tablet   Oral   Take 20 mg by mouth daily.         Marland Kitchen guaiFENesin (MUCINEX) 600 MG 12 hr tablet   Oral   Take 1 tablet (600 mg total) by mouth 2 (two) times daily.   14 tablet   0   . loratadine (CLARITIN) 10 MG tablet   Oral   Take 10 mg by mouth daily.         . pantoprazole (PROTONIX) 40 MG  tablet   Oral   Take 40 mg by mouth 2 (two) times daily.          . potassium chloride (K-DUR,KLOR-CON) 10 MEQ tablet   Oral   Take 10 mEq by mouth daily.         . predniSONE (DELTASONE) 10 MG tablet   Oral   Take 10 mg by mouth daily.         Bernadette Hoit Sodium (STOOL SOFTENER & LAXATIVE PO)   Oral   Take 1 capsule by mouth daily.         Marland Kitchen albuterol (PROVENTIL HFA;VENTOLIN HFA) 108 (90 BASE) MCG/ACT inhaler   Inhalation   Inhale 2 puffs into the lungs every 6 (six) hours as needed. For shortness of breath.          BP 131/64  Pulse 97  Temp(Src) 97.6 F (36.4 C) (Oral)  Resp 16  SpO2 100% Physical Exam  Constitutional: She is oriented to person, place, and time.  HENT:  Head: Normocephalic and atraumatic.  Right Ear: External ear normal.  Left Ear: External ear normal.  Blood clots noted in anterior nares bilaterally.  Septal defects noted in bilateral nares.  No active bleeding noted.    Eyes: Conjunctivae and EOM are normal. Pupils are equal, round, and reactive to light.  Neck: Normal range of motion. Neck supple. No JVD present. No tracheal deviation present. No  thyromegaly present.  Cardiovascular: Normal rate, regular rhythm, normal heart sounds and intact distal pulses.  Exam reveals no gallop and no friction rub.   No murmur heard. Pulmonary/Chest: Effort normal and breath sounds normal. No stridor. No respiratory distress. She has no wheezes. She has no rales. She exhibits no tenderness.  Abdominal: Soft. Bowel sounds are normal. She exhibits no distension and no mass. There is no tenderness. There is no rebound and no guarding.  Musculoskeletal: Normal range of motion. She exhibits edema (trace bilaterally). She exhibits no tenderness.  Lymphadenopathy:    She has no cervical adenopathy.  Neurological: She is alert and oriented to person, place, and time.  Skin: Skin is warm and dry. No rash noted. No erythema. No pallor.    ED Course  Procedures (including critical care time) Labs Review Labs Reviewed - No data to display Imaging Review No results found.  EKG Interpretation   None       MDM   1. Epistaxis    Pt with epistaxis, resolved at arrival in ED.  No source noted, but adherent clot noted to bilateral anterior nares.  Will not dislodge clot at this time, as hemostasis is achieved.  Pt instructed to use afrin and pressure should bleeding start again.  She has been referred back to ENT in 1 week.  She is continue moisturized 02, and ointment to anterior nares to keep moist.  Pt and daughter reports understanding of plan.    Olivia Mackie, MD 12/10/12 [phone]

## 2014-12-02 ENCOUNTER — Emergency Department (HOSPITAL_COMMUNITY)

## 2014-12-02 ENCOUNTER — Encounter (HOSPITAL_COMMUNITY): Payer: Self-pay | Admitting: Emergency Medicine

## 2014-12-02 ENCOUNTER — Inpatient Hospital Stay (HOSPITAL_COMMUNITY)
Admission: EM | Admit: 2014-12-02 | Discharge: 2014-12-04 | DRG: 189 | Disposition: A | Discharge status: Hospice | Attending: Internal Medicine | Admitting: Internal Medicine

## 2014-12-02 DIAGNOSIS — E872 Acidosis: Secondary | ICD-10-CM | POA: Diagnosis present

## 2014-12-02 DIAGNOSIS — Z90710 Acquired absence of both cervix and uterus: Secondary | ICD-10-CM | POA: Diagnosis not present

## 2014-12-02 DIAGNOSIS — N179 Acute kidney failure, unspecified: Secondary | ICD-10-CM

## 2014-12-02 DIAGNOSIS — Z87891 Personal history of nicotine dependence: Secondary | ICD-10-CM

## 2014-12-02 DIAGNOSIS — Z9889 Other specified postprocedural states: Secondary | ICD-10-CM

## 2014-12-02 DIAGNOSIS — K219 Gastro-esophageal reflux disease without esophagitis: Secondary | ICD-10-CM | POA: Diagnosis present

## 2014-12-02 DIAGNOSIS — G9340 Encephalopathy, unspecified: Secondary | ICD-10-CM

## 2014-12-02 DIAGNOSIS — Z9981 Dependence on supplemental oxygen: Secondary | ICD-10-CM | POA: Diagnosis not present

## 2014-12-02 DIAGNOSIS — D72829 Elevated white blood cell count, unspecified: Secondary | ICD-10-CM

## 2014-12-02 DIAGNOSIS — E870 Hyperosmolality and hypernatremia: Secondary | ICD-10-CM | POA: Diagnosis present

## 2014-12-02 DIAGNOSIS — Z7982 Long term (current) use of aspirin: Secondary | ICD-10-CM

## 2014-12-02 DIAGNOSIS — J441 Chronic obstructive pulmonary disease with (acute) exacerbation: Secondary | ICD-10-CM | POA: Diagnosis present

## 2014-12-02 DIAGNOSIS — E43 Unspecified severe protein-calorie malnutrition: Secondary | ICD-10-CM

## 2014-12-02 DIAGNOSIS — Z7952 Long term (current) use of systemic steroids: Secondary | ICD-10-CM | POA: Diagnosis not present

## 2014-12-02 DIAGNOSIS — J9602 Acute respiratory failure with hypercapnia: Secondary | ICD-10-CM

## 2014-12-02 DIAGNOSIS — M1990 Unspecified osteoarthritis, unspecified site: Secondary | ICD-10-CM | POA: Diagnosis present

## 2014-12-02 DIAGNOSIS — Z66 Do not resuscitate: Secondary | ICD-10-CM | POA: Diagnosis present

## 2014-12-02 DIAGNOSIS — J9601 Acute respiratory failure with hypoxia: Principal | ICD-10-CM

## 2014-12-02 DIAGNOSIS — Z79899 Other long term (current) drug therapy: Secondary | ICD-10-CM | POA: Diagnosis not present

## 2014-12-02 DIAGNOSIS — Z823 Family history of stroke: Secondary | ICD-10-CM

## 2014-12-02 DIAGNOSIS — Z515 Encounter for palliative care: Secondary | ICD-10-CM | POA: Diagnosis not present

## 2014-12-02 DIAGNOSIS — R4182 Altered mental status, unspecified: Secondary | ICD-10-CM

## 2014-12-02 DIAGNOSIS — R0600 Dyspnea, unspecified: Secondary | ICD-10-CM | POA: Diagnosis not present

## 2014-12-02 LAB — I-STAT TROPONIN, ED: Troponin i, poc: 0.06 ng/mL (ref 0.00–0.08)

## 2014-12-02 LAB — BLOOD GAS, VENOUS
Acid-Base Excess: 24.5 mmol/L — ABNORMAL HIGH (ref 0.0–2.0)
BICARBONATE: 53.1 meq/L — AB (ref 20.0–24.0)
O2 SAT: 52.6 %
PCO2 VEN: 98 mmHg — AB (ref 45.0–50.0)
PO2 VEN: 31.5 mmHg (ref 30.0–45.0)
Patient temperature: 98.6
TCO2: 51.4 mmol/L (ref 0–100)
pH, Ven: 7.353 — ABNORMAL HIGH (ref 7.250–7.300)

## 2014-12-02 LAB — CBC WITH DIFFERENTIAL/PLATELET
BASOS PCT: 0 %
Basophils Absolute: 0 10*3/uL (ref 0.0–0.1)
EOS ABS: 0.1 10*3/uL (ref 0.0–0.7)
Eosinophils Relative: 0 %
HEMATOCRIT: 28.8 % — AB (ref 36.0–46.0)
Hemoglobin: 8.1 g/dL — ABNORMAL LOW (ref 12.0–15.0)
Lymphocytes Relative: 9 %
Lymphs Abs: 1.2 10*3/uL (ref 0.7–4.0)
MCH: 28.5 pg (ref 26.0–34.0)
MCHC: 28.1 g/dL — AB (ref 30.0–36.0)
MCV: 101.4 fL — ABNORMAL HIGH (ref 78.0–100.0)
MONO ABS: 0.7 10*3/uL (ref 0.1–1.0)
MONOS PCT: 5 %
NEUTROS ABS: 11.2 10*3/uL — AB (ref 1.7–7.7)
Neutrophils Relative %: 86 %
Platelets: 186 10*3/uL (ref 150–400)
RBC: 2.84 MIL/uL — ABNORMAL LOW (ref 3.87–5.11)
RDW: 14.6 % (ref 11.5–15.5)
WBC: 13.1 10*3/uL — ABNORMAL HIGH (ref 4.0–10.5)

## 2014-12-02 LAB — COMPREHENSIVE METABOLIC PANEL
ALBUMIN: 3.5 g/dL (ref 3.5–5.0)
ALK PHOS: 44 U/L (ref 38–126)
ALT: 16 U/L (ref 14–54)
AST: 22 U/L (ref 15–41)
Anion gap: 12 (ref 5–15)
BUN: 38 mg/dL — ABNORMAL HIGH (ref 6–20)
CALCIUM: 9 mg/dL (ref 8.9–10.3)
CO2: 47 mmol/L — AB (ref 22–32)
Chloride: 86 mmol/L — ABNORMAL LOW (ref 101–111)
Creatinine, Ser: 0.94 mg/dL (ref 0.44–1.00)
GFR calc Af Amer: >60 mL/min (ref >60)
GFR calc non Af Amer: 56 mL/min — ABNORMAL LOW (ref >60)
Glucose, Bld: 111 mg/dL — ABNORMAL HIGH (ref 65–99)
Potassium: 3.5 mmol/L (ref 3.5–5.1)
SODIUM: 145 mmol/L (ref 135–145)
Total Bilirubin: 0.5 mg/dL (ref 0.3–1.2)
Total Protein: 5.8 g/dL — ABNORMAL LOW (ref 6.5–8.1)

## 2014-12-02 LAB — URINALYSIS, ROUTINE W REFLEX MICROSCOPIC
BILIRUBIN URINE: NEGATIVE
Glucose, UA: NEGATIVE mg/dL
HGB URINE DIPSTICK: NEGATIVE
Ketones, ur: NEGATIVE mg/dL
Leukocytes, UA: NEGATIVE
Nitrite: NEGATIVE
Protein, ur: NEGATIVE mg/dL
SPECIFIC GRAVITY, URINE: 1.019 (ref 1.005–1.030)
UROBILINOGEN UA: 1 mg/dL (ref 0.0–1.0)
pH: 5.5 (ref 5.0–8.0)

## 2014-12-02 LAB — MAGNESIUM: Magnesium: 1.8 mg/dL (ref 1.7–2.4)

## 2014-12-02 LAB — I-STAT CG4 LACTIC ACID, ED
LACTIC ACID, VENOUS: 0.77 mmol/L (ref 0.5–2.0)
Lactic Acid, Venous: 1.97 mmol/L (ref 0.5–2.0)

## 2014-12-02 LAB — LIPASE, BLOOD: LIPASE: 35 U/L (ref 22–51)

## 2014-12-02 LAB — BRAIN NATRIURETIC PEPTIDE: B Natriuretic Peptide: 327.1 pg/mL — ABNORMAL HIGH (ref 0.0–100.0)

## 2014-12-02 MED ORDER — ONDANSETRON HCL 4 MG PO TABS: 4.0000 mg | ORAL_TABLET | Freq: Four times a day (QID) | ORAL | Status: DC | PRN

## 2014-12-02 MED ORDER — POTASSIUM CHLORIDE CRYS ER 10 MEQ PO TBCR
10.0000 meq | EXTENDED_RELEASE_TABLET | Freq: Every day | ORAL | Status: DC
  Administered 2014-12-02 – 2014-12-03 (×2): 10 meq via ORAL
  Filled 2014-12-02 (×2): qty 1

## 2014-12-02 MED ORDER — ALPRAZOLAM 0.5 MG PO TABS
0.5000 mg | ORAL_TABLET | Freq: Two times a day (BID) | ORAL | Status: DC
  Administered 2014-12-03: 0.5 mg via ORAL
  Filled 2014-12-02: qty 1

## 2014-12-02 MED ORDER — ASPIRIN EC 81 MG PO TBEC
81.0000 mg | DELAYED_RELEASE_TABLET | Freq: Two times a day (BID) | ORAL | Status: DC
  Administered 2014-12-02 – 2014-12-03 (×2): 81 mg via ORAL
  Filled 2014-12-02 (×3): qty 1

## 2014-12-02 MED ORDER — ONDANSETRON HCL 4 MG/2ML IJ SOLN: 4.0000 mg | Freq: Four times a day (QID) | INTRAMUSCULAR | Status: DC | PRN

## 2014-12-02 MED ORDER — POLYETHYLENE GLYCOL 3350 17 G PO PACK: 17.0000 g | PACK | Freq: Every day | ORAL | Status: DC | PRN

## 2014-12-02 MED ORDER — ALPRAZOLAM 1 MG PO TABS
1.0000 mg | ORAL_TABLET | Freq: Every day | ORAL | Status: DC
  Administered 2014-12-02: 1 mg via ORAL
  Filled 2014-12-02: qty 1

## 2014-12-02 MED ORDER — AZITHROMYCIN 500 MG PO TABS
500.0000 mg | ORAL_TABLET | ORAL | Status: DC
  Filled 2014-12-02: qty 1

## 2014-12-02 MED ORDER — HEPARIN SODIUM (PORCINE) 5000 UNIT/ML IJ SOLN
5000.0000 [IU] | Freq: Three times a day (TID) | INTRAMUSCULAR | Status: DC
  Administered 2014-12-02: 5000 [IU] via SUBCUTANEOUS
  Filled 2014-12-02 (×5): qty 1

## 2014-12-02 MED ORDER — DEXTROSE 5 % IV SOLN
500.0000 mg | Freq: Once | INTRAVENOUS | Status: AC
  Administered 2014-12-02: 500 mg via INTRAVENOUS
  Filled 2014-12-02: qty 500

## 2014-12-02 MED ORDER — BUDESONIDE 0.25 MG/2ML IN SUSP
0.2500 mg | Freq: Two times a day (BID) | RESPIRATORY_TRACT | Status: DC
  Administered 2014-12-02 – 2014-12-04 (×4): 0.25 mg via RESPIRATORY_TRACT
  Filled 2014-12-02 (×4): qty 2

## 2014-12-02 MED ORDER — METHYLPREDNISOLONE SODIUM SUCC 125 MG IJ SOLR
60.0000 mg | Freq: Two times a day (BID) | INTRAMUSCULAR | Status: DC
  Administered 2014-12-02 – 2014-12-03 (×2): 60 mg via INTRAVENOUS
  Filled 2014-12-02 (×4): qty 0.96

## 2014-12-02 MED ORDER — HALOPERIDOL LACTATE 5 MG/ML IJ SOLN: 2.0000 mg | Freq: Four times a day (QID) | INTRAMUSCULAR | Status: DC | PRN

## 2014-12-02 MED ORDER — DEXTROSE 5 % IV SOLN
1.0000 g | Freq: Once | INTRAVENOUS | Status: AC
  Administered 2014-12-02: 1 g via INTRAVENOUS
  Filled 2014-12-02: qty 10

## 2014-12-02 MED ORDER — PIPERACILLIN-TAZOBACTAM 3.375 G IVPB
3.3750 g | Freq: Once | INTRAVENOUS | Status: DC
  Filled 2014-12-02: qty 50

## 2014-12-02 MED ORDER — POTASSIUM CHLORIDE ER 8 MEQ PO TBCR
8.0000 meq | EXTENDED_RELEASE_TABLET | Freq: Every day | ORAL | Status: DC
  Filled 2014-12-02: qty 1

## 2014-12-02 MED ORDER — DEXTROSE 5 % IV SOLN
1.0000 g | INTRAVENOUS | Status: DC
  Filled 2014-12-02: qty 10

## 2014-12-02 MED ORDER — VANCOMYCIN HCL IN DEXTROSE 1-5 GM/200ML-% IV SOLN
1000.0000 mg | Freq: Once | INTRAVENOUS | Status: DC
  Filled 2014-12-02: qty 200

## 2014-12-02 MED ORDER — BUTALBITAL-APAP-CAFFEINE 50-325-40 MG PO TABS: 1.0000 | ORAL_TABLET | Freq: Once | ORAL | Status: DC

## 2014-12-02 MED ORDER — SODIUM CHLORIDE 0.9 % IV SOLN
INTRAVENOUS | Status: DC
  Administered 2014-12-02: 20:00:00 via INTRAVENOUS

## 2014-12-02 NOTE — ED Notes (Signed)
Nurse starting IV getting 2nd set of blood cultures and vbg

## 2014-12-02 NOTE — ED Provider Notes (Signed)
CSN: [id number]     Arrival date & time 12/02/14  1333 History   First MD Initiated Contact with Patient 12/02/14 1351     Chief Complaint  Patient presents with  . Shortness of Breath  . Altered Mental Status     (Consider location/radiation/quality/duration/timing/severity/associated sxs/prior Treatment) Patient is a 81 y.o. female presenting with altered mental status.  Altered Mental Status Presenting symptoms: behavior changes, lethargy and partial responsiveness   Severity:  Severe Most recent episode:  Yesterday Episode history:  Single Timing:  Constant Progression:  Worsening Chronicity:  New Context: not head injury, taking medications as prescribed, not a recent change in medication and not a recent illness   Associated symptoms: decreased appetite and weakness (generalized)   Associated symptoms: no abdominal pain, no fever, no headaches, no nausea, no rash (no change, chronic venous stasis) and no vomiting     Past Medical History  Diagnosis Date  . COPD (chronic obstructive pulmonary disease) (HCC)   . Arthritis   . Emphysema   . GERD (gastroesophageal reflux disease)   . Pneumonia 06/19/2012   Past Surgical History  Procedure Laterality Date  . Abdominal hysterectomy    . Breast surgery      left  . Back surgery     Family History  Problem Relation Age of Onset  . Stroke Mother   . Stroke Father    Social History  Substance Use Topics  . Smoking status: Former Smoker -- 2.00 packs/day for 30 years    Quit date: 06/20/1995  . Smokeless tobacco: Never Used  . Alcohol Use: No   OB History    No data available     Review of Systems  Constitutional: Positive for decreased appetite. Negative for fever.  HENT: Negative for sore throat.   Eyes: Negative for visual disturbance.  Respiratory: Positive for cough (unchanted) and shortness of breath.   Cardiovascular: Negative for chest pain.  Gastrointestinal: Negative for nausea, vomiting and abdominal  pain.  Genitourinary: Negative for difficulty urinating.  Musculoskeletal: Negative for back pain and neck pain.  Skin: Negative for rash (no change, chronic venous stasis).  Neurological: Positive for weakness (generalized). Negative for syncope and headaches.      Allergies  Adhesive and Codeine  Home Medications   Prior to Admission medications   Medication Sig Start Date End Date Taking? Authorizing Provider  ALPRAZolam Prudy Feeler(XANAX) 1 MG tablet Take 0.5-1 mg by mouth 3 (three) times daily as needed for anxiety. Take 0.5 tablet twice a day  Take 1 tablet at bedtime   Yes Historical Provider, MD  aspirin EC 81 MG tablet Take 81 mg by mouth 2 (two) times daily.    Yes Historical Provider, MD  budesonide (PULMICORT) 0.25 MG/2ML nebulizer solution Inhale 0.25 mg into the lungs 2 (two) times daily. 11/15/14  Yes Historical Provider, MD  butalbital-acetaminophen-caffeine (FIORICET, ESGIC) 50-325-40 MG per tablet Take 1 tablet by mouth at bedtime. For headache.   Yes Historical Provider, MD  cholecalciferol (VITAMIN D) 1000 UNITS tablet Take 1,000 Units by mouth daily.   Yes Historical Provider, MD  ferrous sulfate 325 (65 FE) MG tablet Take 325 mg by mouth daily.    Yes Historical Provider, MD  furosemide (LASIX) 20 MG tablet Take 20 mg by mouth daily.   Yes Historical Provider, MD  guaiFENesin (MUCINEX) 600 MG 12 hr tablet Take 1 tablet (600 mg total) by mouth 2 (two) times daily. 01/04/12  Yes Laveda Normanhris N Oti, MD  ipratropium-albuterol (DUONEB)  0.5-2.5 (3) MG/3ML SOLN Inhale 3 mLs into the lungs 4 (four) times daily as needed. 11/14/14  Yes Historical Provider, MD  KLOR-CON 8 MEQ tablet Take 8 mEq by mouth daily. 11/24/14  Yes Historical Provider, MD  pantoprazole (PROTONIX) 40 MG tablet Take 40 mg by mouth 2 (two) times daily.    Yes Historical Provider, MD  predniSONE (DELTASONE) 5 MG tablet Take 20 mg by mouth daily.  11/18/12  Yes Historical Provider, MD  pseudoephedrine (SUDAFED) 30 MG tablet  Take 30 mg by mouth daily as needed for congestion.   Yes Historical Provider, MD  albuterol (PROVENTIL HFA;VENTOLIN HFA) 108 (90 BASE) MCG/ACT inhaler Inhale 2 puffs into the lungs every 6 (six) hours as needed. For shortness of breath.    Historical Provider, MD   BP 97/50 mmHg  Pulse 79  Temp(Src) 96.4 F (35.8 C) (Axillary)  Resp 22  SpO2 97% Physical Exam  Constitutional: She is oriented to person, place, and time. She appears lethargic. She appears cachectic. She appears toxic. She has a sickly appearance. She appears ill. No distress.  HENT:  Head: Normocephalic and atraumatic.  Mouth/Throat: No oropharyngeal exudate.  Eyes: Conjunctivae and EOM are normal.  Neck: Normal range of motion.  Cardiovascular: Normal rate, regular rhythm, normal heart sounds and intact distal pulses.  Exam reveals no gallop and no friction rub.   No murmur heard. Pulmonary/Chest: Tachypnea noted. She has decreased breath sounds (bilaterally). She has no wheezes. She has no rales.  Abdominal: Soft. She exhibits no distension. There is no tenderness. There is no guarding.  Musculoskeletal: She exhibits edema (2+ bilateral). She exhibits no tenderness.  Neurological: She is oriented to person, place, and time. She appears lethargic. GCS eye subscore is 3. GCS verbal subscore is 2. GCS motor subscore is 6.  Generalized weakness, equal upper and lower extremity strength bilaterally  Skin: Skin is warm and dry. No rash noted. She is not diaphoretic. No erythema.  Bilateral venous stasis skin changes, erythema  Nursing note and vitals reviewed.   ED Course  Procedures (including critical care time) Labs Review Labs Reviewed  COMPREHENSIVE METABOLIC PANEL - Abnormal; Notable for the following:    Chloride 86 (*)    CO2 47 (*)    Glucose, Bld 111 (*)    BUN 38 (*)    Total Protein 5.8 (*)    GFR calc non Af Amer 56 (*)    All other components within normal limits  CBC WITH DIFFERENTIAL/PLATELET -  Abnormal; Notable for the following:    WBC 13.1 (*)    RBC 2.84 (*)    Hemoglobin 8.1 (*)    HCT 28.8 (*)    MCV 101.4 (*)    MCHC 28.1 (*)    Neutro Abs 11.2 (*)    All other components within normal limits  BLOOD GAS, VENOUS - Abnormal; Notable for the following:    pH, Ven 7.353 (*)    pCO2, Ven 98.0 (*)    Bicarbonate 53.1 (*)    Acid-Base Excess 24.5 (*)    All other components within normal limits  BRAIN NATRIURETIC PEPTIDE - Abnormal; Notable for the following:    B Natriuretic Peptide 327.1 (*)    All other components within normal limits  CULTURE, BLOOD (ROUTINE X 2)  CULTURE, BLOOD (ROUTINE X 2)  URINE CULTURE  URINALYSIS, ROUTINE W REFLEX MICROSCOPIC (NOT AT [facility])  LIPASE, BLOOD  MAGNESIUM  I-STAT CG4 LACTIC ACID, ED  Rosezena Sensor, ED  I-STAT CG4 LACTIC ACID, ED    Imaging Review Ct Head Wo Contrast  12/02/2014  CLINICAL DATA:  Altered mental status EXAM: CT HEAD WITHOUT CONTRAST TECHNIQUE: Contiguous axial images were obtained from the base of the skull through the vertex without intravenous contrast. COMPARISON:  04/05/2009 FINDINGS: Sinuses/Soft tissues: New right mastoid effusion. Other paranasal sinuses are clear. Intracranial: Cerebral volume loss is expected for age. Cerebellar volume loss as well. No mass lesion, hemorrhage, hydrocephalus, acute infarct, intra-axial, or extra-axial fluid collection. IMPRESSION: 1.  No acute intracranial abnormality. 2. Right mastoid effusion. Electronically Signed   By: Jeronimo Greaves M.D.   On: 12/02/2014 15:31   Dg Chest Port 1 View  12/02/2014  CLINICAL DATA:  Altered mental status, hypoxia, weakness for 1 week. Cough for 4 days. COPD, emphysema. Prior smoker. EXAM: PORTABLE CHEST 1 VIEW COMPARISON:  05/25/2013 FINDINGS: There is hyperinflation of the lungs compatible with COPD. Heart is borderline in size. No confluent opacities or effusions. No acute bony abnormality. IMPRESSION: Stable COPD.  No active disease.  Electronically Signed   By: Charlett Nose M.D.   On: 12/02/2014 15:01   I have personally reviewed and evaluated these images and lab results as part of my medical decision-making.   EKG Interpretation None      MDM   Final diagnoses:  Altered mental status, unspecified altered mental status type  Acute encephalopathy  Acute respiratory failure with hypoxia and hypercarbia (HCC)   81 year old female with a history of COPD on 6 L of oxygen at home under hospice care, presents with concern of altered mental status. Discussed options of care with family in detail, including options for comfort measures versus further evaluation. Family reports "we want her as long around as long as possible, she is not in pain," and they desire all workup including blood draws and CT. CT head was done showing no sign of intracranial abnormality. Patient has generalized weakness and altered mental status, her neurologic exam is nonfocal, and have a low suspicion for acute CVA. Patient with leukocytosis, and concern for potential infection, and she is initially given antibiotics for Rocephin and is observed for concern of community acquired pneumonia given report of hypoxia at home, SOB or UTI. Blood gas shows CO2 of 98 with a pH of 7.3, showing compensated respiratory acidosis, however there is concern for hypercarbia resulting in patient's mental altered mental status.  UTI is also potential etiology of symptoms however urinalysis not returned.  Pt to be admitted to Dr. David Stall for further care.  Discussed with family.     Alvira Monday, MD 12/02/14 1843

## 2014-12-02 NOTE — ED Notes (Signed)
Bed: [id number] Expected date:  Expected time:  Means of arrival:  Comments: EMS- 81yo F, SOB/AMS/Hospice Pt

## 2014-12-02 NOTE — ED Notes (Addendum)
Per EMS, pt has hx of COPD. Pt was at home and became unable to move. When EMS arrived, sats were in the 60s. Pt placed on a NRB and transported. Upon arrival, pt desated to the 40s. Placed on NRB until sats recovered then switched to 6L [place]

## 2014-12-02 NOTE — H&P (Signed)
[facility] History and Physical     History and Physical:    Dana Hess   ZOX:[id number] DOB: 02-16-1934 DOA: 12/02/2014  Referring MD/provider: Dr. Alan Ripper PCP: [name], MD   Chief Complaint: confusion  History of Present Illness:   Dana Hess is an 81 y.o. female with past medical history of COPD oxygen dependent on chronic steroids that comes into the hospital for confusion that started about a week prior to admission. The daughter who is a caregiver provide most of the history and she relates she has been getting more weak and confused over the last week until the day of admission when she was not able to wake the patient up. She relates the patient has had no complains no fever no cough no cough with eating or drinking. Has not gotten out of bed in the last 2 days and has not been able to drink or eat and last 24 hours. As per report from the chart when EMS arrived to the home her saturations were in the 60s she was placed on a nonrebreather and her saturations improved to 100%.  Family denies any recent addition or change in her medications, no sick contacts.  In the ED: A basic metabolic panel was done that shows new acute renal failure with a creatinine of 1 with a baseline creatinine of 0.5, a mild leukocytosis of 13 chest x-ray and CT of the head as below work consulted for further evaluation  ROS:   ROS  Constitutional: No fever, no chills;  Appetite normal; No weight loss, no weight gain, no fatigue.   HEENT: No blurry vision, no diplopia, no pharyngitis, no dysphagia  CV: No chest pain, no palpitations, no PND, no orthopnea, no edema.   Resp: No SOB, no cough, no pleuritic pain.  GI: No nausea, no vomiting, no diarrhea, no melena, no hematochezia, no constipation, no abdominal pain.   GU: No dysuria, no hematuria, no frequency, no urgency.  MSK: No myalgias, no arthralgias.   Neuro:  No headache, no focal neurological deficits, no  history of seizures.   Psych: No depression, no anxiety.   Endo: No heat intolerance, no cold intolerance, no polyuria, no polydipsia   Skin: No rashes, no skin lesions.   Heme: No easy bruising.   Travel history: No recent travel.   Past Medical History:   Past Medical History  Diagnosis Date  . COPD (chronic obstructive pulmonary disease) (HCC)   . Arthritis   . Emphysema   . GERD (gastroesophageal reflux disease)   . Pneumonia 06/19/2012    Past Surgical History:   Past Surgical History  Procedure Laterality Date  . Abdominal hysterectomy    . Breast surgery      left  . Back surgery      Social History:   Social History   Social History  . Marital Status: Divorced    Spouse Name: N/A  . Number of Children: N/A  . Years of Education: N/A   Occupational History  . Not on file.   Social History Main Topics  . Smoking status: Former Smoker -- 2.00 packs/day for 30 years    Quit date: 06/20/1995  . Smokeless tobacco: Never Used  . Alcohol Use: No  . Drug Use: No  . Sexual Activity: Not Currently   Other Topics Concern  . Not on file   Social History Narrative    Family history:   Family History  Problem Relation Age of Onset  .  Stroke Mother   . Stroke Father     Allergies   Adhesive and Codeine  Current Medications:   Prior to Admission medications   Medication Sig Start Date End Date Taking? Authorizing Provider  ALPRAZolam Prudy Feeler) 1 MG tablet Take 0.5-1 mg by mouth 3 (three) times daily as needed for anxiety. Take 0.5 tablet twice a day  Take 1 tablet at bedtime   Yes Historical Provider, MD  aspirin EC 81 MG tablet Take 81 mg by mouth 2 (two) times daily.    Yes Historical Provider, MD  budesonide (PULMICORT) 0.25 MG/2ML nebulizer solution Inhale 0.25 mg into the lungs 2 (two) times daily. 11/15/14  Yes Historical Provider, MD  butalbital-acetaminophen-caffeine (FIORICET, ESGIC) 50-325-40 MG per tablet Take 1 tablet by mouth at bedtime. For  headache.   Yes Historical Provider, MD  cholecalciferol (VITAMIN D) 1000 UNITS tablet Take 1,000 Units by mouth daily.   Yes Historical Provider, MD  ferrous sulfate 325 (65 FE) MG tablet Take 325 mg by mouth daily.    Yes Historical Provider, MD  furosemide (LASIX) 20 MG tablet Take 20 mg by mouth daily.   Yes Historical Provider, MD  guaiFENesin (MUCINEX) 600 MG 12 hr tablet Take 1 tablet (600 mg total) by mouth 2 (two) times daily. 01/04/12  Yes Laveda Norman, MD  ipratropium-albuterol (DUONEB) 0.5-2.5 (3) MG/3ML SOLN Inhale 3 mLs into the lungs 4 (four) times daily as needed. 11/14/14  Yes Historical Provider, MD  KLOR-CON 8 MEQ tablet Take 8 mEq by mouth daily. 11/24/14  Yes Historical Provider, MD  pantoprazole (PROTONIX) 40 MG tablet Take 40 mg by mouth 2 (two) times daily.    Yes Historical Provider, MD  predniSONE (DELTASONE) 5 MG tablet Take 20 mg by mouth daily.  11/18/12  Yes Historical Provider, MD  pseudoephedrine (SUDAFED) 30 MG tablet Take 30 mg by mouth daily as needed for congestion.   Yes Historical Provider, MD  albuterol (PROVENTIL HFA;VENTOLIN HFA) 108 (90 BASE) MCG/ACT inhaler Inhale 2 puffs into the lungs every 6 (six) hours as needed. For shortness of breath.    Historical Provider, MD    Physical Exam:   Filed Vitals:   12/02/14 1342 12/02/14 1353  BP:  149/64  Pulse:  95  Temp:  96.2 F (35.7 C)  TempSrc:  Axillary  Resp:  18  SpO2: 98% 100%     Physical Exam: Blood pressure 149/64, pulse 95, temperature 96.2 F (35.7 C), temperature source Axillary, resp. rate 18, SpO2 100 %. Gen: No acute distress. Significantly confused only responsive to touch, cachectic appearing Head: Normocephalic, atraumatic. Eyes: PERRL, EOMI, sclerae nonicteric. Mouth: Oropharynx Neck: Supple, no thyromegaly, no lymphadenopathy, no jugular venous distention. Chest: Moderate air movement no wheezing but crackles on the right CV: Regular rate and rhythm with positive S1-S2 Abdomen:  Soft, nontender, nondistended with normal active bowel sounds. Extremities: Extremities Skin: Warm and dry, extremely thin and frail skin Neuro: Alert and oriented times0; cranial nerves II through XII grossly intact. Psych: Mood and affect normal.   Data Review:    Labs: Basic Metabolic Panel:  Recent Labs Lab 12/02/14 1449  NA 145  K 3.5  CL 86*  CO2 47*  GLUCOSE 111*  BUN 38*  CREATININE 0.94  CALCIUM 9.0  MG 1.8   Liver Function Tests:  Recent Labs Lab 12/02/14 1449  AST 22  ALT 16  ALKPHOS 44  BILITOT 0.5  PROT 5.8*  ALBUMIN 3.5    Recent Labs Lab  12/02/14 1449  LIPASE 35   No results for input(s): AMMONIA in the last 168 hours. CBC:  Recent Labs Lab 12/02/14 1449  WBC 13.1*  NEUTROABS 11.2*  HGB 8.1*  HCT 28.8*  MCV 101.4*  PLT 186   Cardiac Enzymes: No results for input(s): CKTOTAL, CKMB, CKMBINDEX, TROPONINI in the last 168 hours.  BNP (last 3 results) No results for input(s): PROBNP in the last 8760 hours. CBG: No results for input(s): GLUCAP in the last 168 hours.  Radiographic Studies: Ct Head Wo Contrast  12/02/2014  CLINICAL DATA:  Altered mental status EXAM: CT HEAD WITHOUT CONTRAST TECHNIQUE: Contiguous axial images were obtained from the base of the skull through the vertex without intravenous contrast. COMPARISON:  04/05/2009 FINDINGS: Sinuses/Soft tissues: New right mastoid effusion. Other paranasal sinuses are clear. Intracranial: Cerebral volume loss is expected for age. Cerebellar volume loss as well. No mass lesion, hemorrhage, hydrocephalus, acute infarct, intra-axial, or extra-axial fluid collection. IMPRESSION: 1.  No acute intracranial abnormality. 2. Right mastoid effusion. Electronically Signed   By: Jeronimo GreavesKyle  Talbot M.D.   On: 12/02/2014 15:31   Dg Chest Port 1 View  12/02/2014  CLINICAL DATA:  Altered mental status, hypoxia, weakness for 1 week. Cough for 4 days. COPD, emphysema. Prior smoker. EXAM: PORTABLE CHEST 1  VIEW COMPARISON:  05/25/2013 FINDINGS: There is hyperinflation of the lungs compatible with COPD. Heart is borderline in size. No confluent opacities or effusions. No acute bony abnormality. IMPRESSION: Stable COPD.  No active disease. Electronically Signed   By: Charlett NoseKevin  Dover M.D.   On: 12/02/2014 15:01   *I have personally reviewed the images above*  EKG: Independently reviewed. None   Assessment/Plan:   Acute respiratory failure with hypoxia (HCC) Unclear etiology of her hypoxia, it could be a primary respiratory etiology I agree with starting her on IV steroids and empiric antibiotics for possible pneumonia, Rocephin and azithromycin as she does have crackles in the right area-chest wall. Admitted to regular bed, the family will like the patient to continue to be DNR/DNI. He understands that if there is no improvement in the next 48 hours she may not be responding to treatment. And then they would like to proceed with comfort measures. She is high risk of developing acute confusional state use IV Haldol when necessary as needed.  Acute encephalopathy: Unclear probably hypercarbia as her CO2 is 97 on a venous gas, her bicarbonate is 47 but her pH is 7.35. We cannot put her on BiPAP as she is significantly somnolent. I will try to keep saturations between 88 and 90% on nasal cannula. Other possible etiologies could be a urinary tract infection.  Leukocytosis Unclear etiology she is on steroids on low-dose, it could be a primary lung infectious etiology like community-acquired pneumonia versus urinary tract infection. I'll check a UA and send for urine cultures, Rocephin should cover any pathology.  Acute kidney injury: Probably prerenal her chloride is less than 100 start her on IV hydration and recheck basic metabolic panel in the morning.   DVT prophylaxis Heparin  Code Status: DNR/DNI Family Communication: 2 daughters and 2 sons Disposition Plan: Home when stable.  Time  spent: 75 min  Marinda ElkFELIZ ORTIZ, ABRAHAM [facility] Pager [phone]  If 7PM-7AM, please contact night-coverage [email or web address] Password [email or web address] 12/02/2014, 5:11 PM

## 2014-12-02 NOTE — ED Notes (Signed)
Notified edp istat ladtic acid results

## 2014-12-02 NOTE — ED Notes (Signed)
Delay in lab draw, exray in room 

## 2014-12-02 NOTE — Progress Notes (Signed)
PHARMACY NOTE -    Pharmacy has been assisting with renal  dosing of azithromax and ceftriaxone.  CrCl 2340mls/min  is stable and need for further dosage adjustment appears unlikely at present.    Will sign off at this time.  Please reconsult if a change in clinical status warrants re-evaluation of dosage.  Arley Phenixllen Darbi Chandran RPh 12/02/2014, 7:41 PM Pager [phone]

## 2014-12-02 NOTE — ED Notes (Signed)
Nurse drawing labs. 

## 2014-12-03 DIAGNOSIS — R0600 Dyspnea, unspecified: Secondary | ICD-10-CM

## 2014-12-03 DIAGNOSIS — Z515 Encounter for palliative care: Secondary | ICD-10-CM

## 2014-12-03 DIAGNOSIS — Z7189 Other specified counseling: Secondary | ICD-10-CM

## 2014-12-03 LAB — CBC
HCT: 30.6 % — ABNORMAL LOW (ref 36.0–46.0)
Hemoglobin: 8.2 g/dL — ABNORMAL LOW (ref 12.0–15.0)
MCH: 28.3 pg (ref 26.0–34.0)
MCHC: 26.8 g/dL — ABNORMAL LOW (ref 30.0–36.0)
MCV: 105.5 fL — ABNORMAL HIGH (ref 78.0–100.0)
Platelets: 195 10*3/uL (ref 150–400)
RBC: 2.9 MIL/uL — ABNORMAL LOW (ref 3.87–5.11)
RDW: 15 % (ref 11.5–15.5)
WBC: 12.8 10*3/uL — ABNORMAL HIGH (ref 4.0–10.5)

## 2014-12-03 LAB — HIV ANTIBODY (ROUTINE TESTING W REFLEX): HIV Screen 4th Generation wRfx: NONREACTIVE

## 2014-12-03 LAB — BASIC METABOLIC PANEL WITH GFR
BUN: 36 mg/dL — ABNORMAL HIGH (ref 6–20)
CO2: >50 mmol/L — ABNORMAL HIGH (ref 22–32)
Calcium: 8.8 mg/dL — ABNORMAL LOW (ref 8.9–10.3)
Chloride: 85 mmol/L — ABNORMAL LOW (ref 101–111)
Creatinine, Ser: 0.86 mg/dL (ref 0.44–1.00)
GFR calc Af Amer: >60 mL/min
GFR calc non Af Amer: >60 mL/min
Glucose, Bld: 149 mg/dL — ABNORMAL HIGH (ref 65–99)
Potassium: 4.9 mmol/L (ref 3.5–5.1)
Sodium: 146 mmol/L — ABNORMAL HIGH (ref 135–145)

## 2014-12-03 MED ORDER — GLYCOPYRROLATE 0.2 MG/ML IJ SOLN
0.2000 mg | INTRAMUSCULAR | Status: DC | PRN
  Administered 2014-12-03 – 2014-12-04 (×2): 0.2 mg via INTRAVENOUS
  Filled 2014-12-03 (×3): qty 1

## 2014-12-03 MED ORDER — ALBUTEROL SULFATE (2.5 MG/3ML) 0.083% IN NEBU
INHALATION_SOLUTION | RESPIRATORY_TRACT | Status: AC
  Administered 2014-12-03: 2.5 mg via RESPIRATORY_TRACT
  Filled 2014-12-03: qty 3

## 2014-12-03 MED ORDER — HYDROMORPHONE HCL 1 MG/ML IJ SOLN
0.2000 mg | INTRAMUSCULAR | Status: DC | PRN
Start: 2014-12-03 — End: 2014-12-04
  Administered 2014-12-03 – 2014-12-04 (×3): 0.2 mg via INTRAVENOUS
  Filled 2014-12-03 (×3): qty 1

## 2014-12-03 MED ORDER — BISACODYL 10 MG RE SUPP: 10.0000 mg | Freq: Every day | RECTAL | Status: DC | PRN

## 2014-12-03 MED ORDER — LORAZEPAM 2 MG/ML IJ SOLN: 1.0000 mg | INTRAMUSCULAR | Status: DC | PRN

## 2014-12-03 MED ORDER — LORAZEPAM 2 MG/ML PO CONC: 1.0000 mg | ORAL | Status: DC | PRN

## 2014-12-03 MED ORDER — LORAZEPAM 1 MG PO TABS
1.0000 mg | ORAL_TABLET | ORAL | Status: DC | PRN
Start: 2014-12-03 — End: 2014-12-04

## 2014-12-03 MED ORDER — DEXTROSE 5 % IV SOLN
INTRAVENOUS | Status: DC
  Administered 2014-12-03: 09:00:00 via INTRAVENOUS

## 2014-12-03 MED ORDER — ALBUTEROL SULFATE (2.5 MG/3ML) 0.083% IN NEBU
2.5000 mg | INHALATION_SOLUTION | RESPIRATORY_TRACT | Status: DC
  Administered 2014-12-03 – 2014-12-04 (×7): 2.5 mg via RESPIRATORY_TRACT
  Filled 2014-12-03 (×6): qty 3

## 2014-12-03 NOTE — Progress Notes (Signed)
[facility] PROGRESS NOTE    Progress Note   Dana Hess:[id number] DOB: 03-06-1933 DOA: 12/02/2014 PCP: [name], MD   Brief Narrative:   Dana Hess is an 81 y.o. female past medical history assault COPD oxygen dependent on hospice comes in for confusion.  Assessment/Plan:  Acute respiratory failure with hypoxia (HCC)/COPD exacerbation: - She is on 6 L of oxygen at home. - We'll continue IV steroids and inhalers. She's currently on Rocephin and azithromycin for possible COPD exacerbation. - She is currently on a nonrebreather as her saturations drop when they put on a nasal cannula. - Family does not seem to understand the progression of her disease. I have tried to explain to them that she is on maximum therapy on a nonrebreather and she continues to sat around 88-89%. - We consult palliative care for goals of care.  Acute encephalopathy: - As per family members she is improving, she is thirsty.  Leukocytosis: - Improvement compared to yesterday, she is not hypothermic. Urine cultures are negative. - We'll continue Rocephin and azithromycin for possible pneumonia versus urinary tract infection.  Severe protein-calorie malnutrition (HCC)  Acute kidney injury: Slight improvement with IV hydration.  Hypernatremia: - Her sodium has gone up on normal saline and her chloride is trending down. This is probably due to her metabolic compensation for respiratory acidosis. Her bicarbonate is rising and I think this is the reason why her chloride is decreasing. - We'll change her IV fluids to D5 - Recheck a basic metabolic panel in the morning.    DVT Prophylaxis - Lovenox ordered.  Family Communication: none Disposition Plan: Home when stable. Code Status:     Code Status Orders        Start     Ordered   12/02/14 1927  Do not attempt resuscitation (DNR)   Continuous    Question Answer Comment  In the event of cardiac or respiratory ARREST Do  not call a "code blue"   In the event of cardiac or respiratory ARREST Do not perform Intubation, CPR, defibrillation or ACLS   In the event of cardiac or respiratory ARREST Use medication by any route, position, wound care, and other measures to relive pain and suffering. May use oxygen, suction and manual treatment of airway obstruction as needed for comfort.      12/02/14 1926    Advance Directive Documentation        Most Recent Value   Type of Advance Directive  Healthcare Power of Attorney, Living will   Pre-existing out of facility DNR order (yellow form or pink MOST form)     "MOST" Form in Place?          IV Access:    Peripheral IV   Procedures and diagnostic studies:   Ct Head Wo Contrast  12/02/2014  CLINICAL DATA:  Altered mental status EXAM: CT HEAD WITHOUT CONTRAST TECHNIQUE: Contiguous axial images were obtained from the base of the skull through the vertex without intravenous contrast. COMPARISON:  04/05/2009 FINDINGS: Sinuses/Soft tissues: New right mastoid effusion. Other paranasal sinuses are clear. Intracranial: Cerebral volume loss is expected for age. Cerebellar volume loss as well. No mass lesion, hemorrhage, hydrocephalus, acute infarct, intra-axial, or extra-axial fluid collection. IMPRESSION: 1.  No acute intracranial abnormality. 2. Right mastoid effusion. Electronically Signed   By: Jeronimo Greaves M.D.   On: 12/02/2014 15:31   Dg Chest Port 1 View  12/02/2014  CLINICAL DATA:  Altered mental status, hypoxia, weakness for  1 week. Cough for 4 days. COPD, emphysema. Prior smoker. EXAM: PORTABLE CHEST 1 VIEW COMPARISON:  05/25/2013 FINDINGS: There is hyperinflation of the lungs compatible with COPD. Heart is borderline in size. No confluent opacities or effusions. No acute bony abnormality. IMPRESSION: Stable COPD.  No active disease. Electronically Signed   By: Charlett NoseKevin  Dover M.D.   On: 12/02/2014 15:01     Medical Consultants:    None.  Anti-Infectives:    Anti-infectives    Start     Dose/Rate Route Frequency Ordered Stop   12/03/14 1800  cefTRIAXone (ROCEPHIN) 1 g in dextrose 5 % 50 mL IVPB     1 g 100 mL/hr over 30 Minutes Intravenous Every 24 hours 12/02/14 1926 12/10/14 1759   12/03/14 1600  azithromycin (ZITHROMAX) tablet 500 mg     500 mg Oral Every 24 hours 12/02/14 1926 12/10/14 1559   12/02/14 1500  cefTRIAXone (ROCEPHIN) 1 g in dextrose 5 % 50 mL IVPB     1 g 100 mL/hr over 30 Minutes Intravenous  Once 12/02/14 1452 12/02/14 1826   12/02/14 1500  azithromycin (ZITHROMAX) 500 mg in dextrose 5 % 250 mL IVPB     500 mg 250 mL/hr over 60 Minutes Intravenous  Once 12/02/14 1452 12/02/14 1755   12/02/14 1415  vancomycin (VANCOCIN) IVPB 1000 mg/200 mL premix  Status:  Discontinued     1,000 mg 200 mL/hr over 60 Minutes Intravenous  Once 12/02/14 1401 12/02/14 1452   12/02/14 1415  piperacillin-tazobactam (ZOSYN) IVPB 3.375 g  Status:  Discontinued     3.375 g 12.5 mL/hr over 240 Minutes Intravenous  Once 12/02/14 1401 12/02/14 1452      Subjective:    Dana Hess she is awake to voice she is complaining of thirst.  Objective:    Filed Vitals:   12/02/14 1757 12/02/14 1900 12/02/14 2030 12/02/14 2143  BP: 97/50 103/38  117/47  Pulse: 79 114  86  Temp:  97.4 F (36.3 C)  97.3 F (36.3 C)  TempSrc:  Oral  Oral  Resp: 22 20  26   SpO2: 97% 93% 93% 89%    Intake/Output Summary (Last 24 hours) at 12/03/14 [id number] Last data filed at 12/02/14 1730  Gross per 24 hour  Intake      0 ml  Output    300 ml  Net   -300 ml   There were no vitals filed for this visit.  Exam: Gen:  NAD Cardiovascular:  RRR. Chest and lungs:   Good air movement CTA B/L. Abdomen:  Abdomen soft, NT/ND, + BS Extremities:  No C/E/C   Data Reviewed:    Labs: Basic Metabolic Panel:  Recent Labs Lab 12/02/14 1449 12/03/14 0616  NA 145 146*  K 3.5 4.9  CL 86* 85*  CO2 47* >50*  GLUCOSE 111* 149*  BUN 38* 36*  CREATININE 0.94  0.86  CALCIUM 9.0 8.8*  MG 1.8  --    GFR CrCl cannot be calculated (Unknown ideal weight.). Liver Function Tests:  Recent Labs Lab 12/02/14 1449  AST 22  ALT 16  ALKPHOS 44  BILITOT 0.5  PROT 5.8*  ALBUMIN 3.5    Recent Labs Lab 12/02/14 1449  LIPASE 35   No results for input(s): AMMONIA in the last 168 hours. Coagulation profile No results for input(s): INR, PROTIME in the last 168 hours.  CBC:  Recent Labs Lab 12/02/14 1449 12/03/14 0616  WBC 13.1* 12.8*  NEUTROABS 11.2*  --  HGB 8.1* 8.2*  HCT 28.8* 30.6*  MCV 101.4* 105.5*  PLT 186 195   Cardiac Enzymes: No results for input(s): CKTOTAL, CKMB, CKMBINDEX, TROPONINI in the last 168 hours. BNP (last 3 results) No results for input(s): PROBNP in the last 8760 hours. CBG: No results for input(s): GLUCAP in the last 168 hours. D-Dimer: No results for input(s): DDIMER in the last 72 hours. Hgb A1c: No results for input(s): HGBA1C in the last 72 hours. Lipid Profile: No results for input(s): CHOL, HDL, LDLCALC, TRIG, CHOLHDL, LDLDIRECT in the last 72 hours. Thyroid function studies: No results for input(s): TSH, T4TOTAL, T3FREE, THYROIDAB in the last 72 hours.  Invalid input(s): FREET3 Anemia work up: No results for input(s): VITAMINB12, FOLATE, FERRITIN, TIBC, IRON, RETICCTPCT in the last 72 hours. Sepsis Labs:  Recent Labs Lab 12/02/14 1449 12/02/14 1504 12/02/14 1756 12/03/14 0616  WBC 13.1*  --   --  12.8*  LATICACIDVEN  --  1.97 0.77  --    Microbiology No results found for this or any previous visit (from the past 240 hour(s)).   Medications:   . ALPRAZolam  0.5-1 mg Oral BID  . ALPRAZolam  1 mg Oral QHS  . aspirin EC  81 mg Oral BID  . azithromycin  500 mg Oral Q24H  . budesonide  0.25 mg Inhalation BID  . butalbital-acetaminophen-caffeine  1 tablet Oral Once  . cefTRIAXone (ROCEPHIN)  IV  1 g Intravenous Q24H  . heparin  5,000 Units Subcutaneous 3 times per day  .  methylPREDNISolone (SOLU-MEDROL) injection  60 mg Intravenous Q12H  . potassium chloride  10 mEq Oral Daily   Continuous Infusions: . dextrose      Time spent: 25 min   LOS: 1 day   Marinda Elk  [facility] Pager [phone]  *Please refer to [email or web address], password [email or web address] to get updated schedule on who will round on this patient, as hospitalists switch teams weekly. If 7PM-7AM, please contact night-coverage at [email or web address], password [email or web address] for any overnight needs.  12/03/2014, 8:14 AM

## 2014-12-03 NOTE — Progress Notes (Signed)
Dr. Neale BurlyFreeman at bedside speaking to family about comfort care for patient.  Family had taken pt off non rebreather and put on [place] 6 L.  Now, family wants pt put back on non rebreather.  Patient on non rebreather and family has a pulse ox monitoring patient.  Dr. Neale BurlyFreeman informed family of patient status and comfort care.  Family informed of Dilaudid medication.

## 2014-12-03 NOTE — Progress Notes (Signed)
RT Note: RT was called this AM requesting help weaning the patients O2 down. She was on a partial re breather being ran at only 6lpm when RT arrived to see the patient. Her son stated that he had turned it down on the wall. RT notified him that either the RN or RT can titrate her oxygen as she needs it and he understood the importance of not altering the settings himself that we put her on but to notify us if he has any concerns or questions. He is very nice and understood. Patients RN was notified of the situation as well. Post nebulizer treatment she was placed on 6lpm which is her home regimen per her family and she continues to maintain well on that with an Spo2-98%. RT will continue to monitor.

## 2014-12-03 NOTE — Consult Note (Signed)
Consultation Note Date: 12/03/2014   Patient Name: Dana Hess  DOB: [birth year]  MRN: [id number]  Age / Sex: 81 y.o., female   PCP: Orpah Melter, MD Referring Physician: Charlynne Cousins, MD  Reason for Consultation: Establishing goals of care and Terminal care  Palliative Care Assessment and Plan Summary of Established Goals of Care and Medical Treatment Preferences   Clinical Assessment/Narrative: Dana Hess is a 81 year old female with past medical history of end-stage COPD who is currently enrolled in hospice services with [facility]. I met with her son, daughter, and the patient this morning. They report that she has been living at home with her daughter Dana Hess and is also beginning care from her other children to ensure that she has made with her 24 hours per day. They report that over the past week she has become more confused and sleepy and so she was brought to the ED. She is currently being treated for acute respiratory failure likely due to progression of her underlying disease.   They report that in the past she has been wanting to transition to [facility] but has been waiting until she was deemed sick enough to the admitted there. Currently, they report that her sister Dana Hess is now talking about wanting to take her mother home instead of going to [facility].  I came back and met with the family and with Dana Hess present. At that point in time there was a distinct change in Dana Hess's clinical course. Earlier today when I was in the room she was talking and interactive. When I revisited with them she was barely able to open her eyes and was not responding much to either verbal or tactile stimulation.  I told the family I'm afraid that she is transitioning to actively dying.  We discussed course moving forward and I recommended that we make changes to focus strictly on her comfort as it is been clear that this has been the intent of the care she has been receiving and in line  with prior wishes she has expressed to her family.  Based upon her clinical picture at this time, there is a high likelihood this will be a terminal admission. If she does stabilize to the point where we could consider transfer, she is enrolled with hospice and palliative care [facility]. Options for discharge would include back to home with hospice services versus going to [facility]. Based upon her presentation today, I would expect her clinical course to be in the matter hours to days.  Contacts/Participants in Discussion: Primary Decision Maker: The patient's children, especially her daughter Dana Hess HCPOA: None on chart   Code Status/Advance Care Planning:  DO NOT RESUSCITATE. She is currently enrolled in hospice services.  Symptom Management:   Pain: Does not currently appear to be an issue. Plan for Dilaudid 0.2 mg as needed  Shortness of breath: We'll plan for initiation of opioid to help with her shortness of breath. Her family reports she does not tolerate morphine and it causes her to feel sick. We'll therefore plan for Dilaudid 0.2 mg on an as-needed basis. If her needs increase, would have low threshold to begin continuous infusion to ensure her comfort.  Agitation: Haldol as needed  Anxiety: Ativan as needed  Excess secretions: Glycopyrrolate as needed  Constipation: Dulcolax as needed  Psycho-social/Spiritual:   Support System: Strong through family  Desire for further Chaplaincy support:yes  Prognosis: Hours - Days  Discharge Planning:  To be determined. She is currently enrolled  with hospice services, however this may be terminal admission. If she stabilizes for potential discharge, would consider home with hospice versus [facility].       Chief Complaint/History of Present Illness:  81 year old female enrolled in hospice service for COPD  Primary Diagnoses  Present on Admission:  . Acute encephalopathy . Acute respiratory failure with hypoxia  (HCC)  Palliative Review of Systems: Unable to obtain I have reviewed the medical record, interviewed the patient and family, and examined the patient. The following aspects are pertinent.  Past Medical History  Diagnosis Date  . COPD (chronic obstructive pulmonary disease) ([place])   . Arthritis   . Emphysema   . GERD (gastroesophageal reflux disease)   . Pneumonia 06/19/2012   Social History   Social History  . Marital Status: Divorced    Spouse Name: N/A  . Number of Children: N/A  . Years of Education: N/A   Social History Main Topics  . Smoking status: Former Smoker -- 2.00 packs/day for 30 years    Quit date: 06/20/1995  . Smokeless tobacco: Never Used  . Alcohol Use: No  . Drug Use: No  . Sexual Activity: Not Currently   Other Topics Concern  . None   Social History Narrative   Family History  Problem Relation Age of Onset  . Stroke Mother   . Stroke Father    Scheduled Meds: . albuterol  2.5 mg Nebulization Q4H  . budesonide  0.25 mg Inhalation BID   Continuous Infusions:  PRN Meds:.bisacodyl, glycopyrrolate, haloperidol lactate, HYDROmorphone (DILAUDID) injection, LORazepam **OR** LORazepam **OR** LORazepam, ondansetron **OR** ondansetron (ZOFRAN) IV Medications Prior to Admission:  Prior to Admission medications   Medication Sig Start Date End Date Taking? Authorizing Provider  ALPRAZolam Duanne Moron) 1 MG tablet Take 0.5-1 mg by mouth 3 (three) times daily as needed for anxiety. Take 0.5 tablet twice a day  Take 1 tablet at bedtime   Yes Historical Provider, MD  aspirin EC 81 MG tablet Take 81 mg by mouth 2 (two) times daily.    Yes Historical Provider, MD  budesonide (PULMICORT) 0.25 MG/2ML nebulizer solution Inhale 0.25 mg into the lungs 2 (two) times daily. 11/15/14  Yes Historical Provider, MD  butalbital-acetaminophen-caffeine (FIORICET, ESGIC) 50-325-40 MG per tablet Take 1 tablet by mouth at bedtime. For headache.   Yes Historical Provider, MD   cholecalciferol (VITAMIN D) 1000 UNITS tablet Take 1,000 Units by mouth daily.   Yes Historical Provider, MD  ferrous sulfate 325 (65 FE) MG tablet Take 325 mg by mouth daily.    Yes Historical Provider, MD  furosemide (LASIX) 20 MG tablet Take 20 mg by mouth daily.   Yes Historical Provider, MD  guaiFENesin (MUCINEX) 600 MG 12 hr tablet Take 1 tablet (600 mg total) by mouth 2 (two) times daily. 01/04/12  Yes Monika Salk, MD  ipratropium-albuterol (DUONEB) 0.5-2.5 (3) MG/3ML SOLN Inhale 3 mLs into the lungs 4 (four) times daily as needed. 11/14/14  Yes Historical Provider, MD  KLOR-CON 8 MEQ tablet Take 8 mEq by mouth daily. 11/24/14  Yes Historical Provider, MD  pantoprazole (PROTONIX) 40 MG tablet Take 40 mg by mouth 2 (two) times daily.    Yes Historical Provider, MD  predniSONE (DELTASONE) 5 MG tablet Take 20 mg by mouth daily.  11/18/12  Yes Historical Provider, MD  pseudoephedrine (SUDAFED) 30 MG tablet Take 30 mg by mouth daily as needed for congestion.   Yes Historical Provider, MD  albuterol (PROVENTIL HFA;VENTOLIN HFA) 108 (90  BASE) MCG/ACT inhaler Inhale 2 puffs into the lungs every 6 (six) hours as needed. For shortness of breath.    Historical Provider, MD   Allergies  Allergen Reactions  . Adhesive [Tape] Other (See Comments)    Patient has thin skin from prednisone treatment  . Codeine Nausea And Vomiting and Rash   CBC:    Component Value Date/Time   WBC 12.8* 12/03/2014 0616   HGB 8.2* 12/03/2014 0616   HCT 30.6* 12/03/2014 0616   PLT 195 12/03/2014 0616   MCV 105.5* 12/03/2014 0616   NEUTROABS 11.2* 12/02/2014 1449   LYMPHSABS 1.2 12/02/2014 1449   MONOABS 0.7 12/02/2014 1449   EOSABS 0.1 12/02/2014 1449   BASOSABS 0.0 12/02/2014 1449   Comprehensive Metabolic Panel:    Component Value Date/Time   NA 146* 12/03/2014 0616   K 4.9 12/03/2014 0616   CL 85* 12/03/2014 0616   CO2 >50* 12/03/2014 0616   BUN 36* 12/03/2014 0616   CREATININE 0.86 12/03/2014 0616    GLUCOSE 149* 12/03/2014 0616   CALCIUM 8.8* 12/03/2014 0616   AST 22 12/02/2014 1449   ALT 16 12/02/2014 1449   ALKPHOS 44 12/02/2014 1449   BILITOT 0.5 12/02/2014 1449   PROT 5.8* 12/02/2014 1449   ALBUMIN 3.5 12/02/2014 1449    Physical Exam: Vital Signs: BP 117/47 mmHg  Pulse 86  Temp(Src) 97.3 F (36.3 C) (Oral)  Resp 26  SpO2 98% SpO2: SpO2: 98 % O2 Device: O2 Device: NRB O2 Flow Rate: O2 Flow Rate (L/min): 15 L/min Intake/output summary:  Intake/Output Summary (Last 24 hours) at 12/03/14 1913 Last data filed at 12/03/14 0825  Gross per 24 hour  Intake      0 ml  Output      0 ml  Net      0 ml   LBM: Last BM Date: 12/02/14 (per family) Baseline Weight:   Most recent weight:    Exam Findings:  General: Chronically ill-appearing, elderly female. Lying in bed wearing nonrebreather, does not respond to verbal or tactile stimulation.  HEENT: No bruits, no JVD Heart: Tachycardic. No murmur appreciated., Diminished peripheral pulses Lungs: Poor air movement, scattered rhonchi Abdomen: Soft, nontender, nondistended, positive bowel sounds.  Ext: Bilateral lower extremity edema Skin: Cool feet, warm hands, dry with no mottling Neuro: Unable to assess.          Palliative Performance Scale: 10               Additional Data Reviewed: Recent Labs     12/02/14  1449  12/03/14  0616  WBC  13.1*  12.8*  HGB  8.1*  8.2*  PLT  186  195  NA  145  146*  BUN  38*  36*  CREATININE  0.94  0.86     Time In: 1430 Time Out: 1550 Time Total: 80 Greater than 50%  of this time was spent counseling and coordinating care related to the above assessment and plan.  Signed by: Micheline Rough, MD  Micheline Rough, MD  12/03/2014, 7:13 PM  Please contact Palliative Medicine Team phone at [phone] for questions and concerns.

## 2014-12-03 NOTE — Progress Notes (Signed)
[facility] RN Visit GIP related admission to [facility] DX: COPD, pt. Is a DNR code status. Pt. Seen in room sleeping in semifowler's postion. She did not awaken to her name or to touch. Son Viviann SpareSteven and daughter Gardiner RamusLillian at the bedside. Pt. Is on a non - rebreather mask at 15 L. Pt's respirations are labored with audible expiratory wheezing. Extremities warm and dry. Dr. Neale BurlyFreeman in to see patient and family. Emotional support provided. Dtr. Susie came in as this Clinical research associatewriter was leaving and was  advised Dr. Neale BurlyFreeman would return later to speak with her. LCSW primary RN and [facility] LCSW have both informed writer that pt. has [facility]. [facility] team will continue to monitor and will f/u daily. Transfer sumary and med list placed on shadow chart. Please call with any hospice questions.   Sharen HeckLisa Strandberg RN [facility] Hospital Liaison [phone]

## 2014-12-03 NOTE — Progress Notes (Signed)
Chaplain with family members as Dana Hess nears death. She can still speak in short sentences and these reflect her love and care for her family. She and family indicates here are no spiritual issues remaining for either pt or family. She is starting see visualize and respond to others not physically present. She is surrounded by loving and supportive family members who are attentive to her physical, mental and spiritual needs. A prayer for Dana Hess was prayed.   Page the chaplain should any desire another visit and/or when Dana Hess dies.  Dana Hess, DMin Chaplain

## 2014-12-03 NOTE — Progress Notes (Signed)
RT Note: Rt came to give patient her treatment and MD was at the bedside having a family meeting. The CNA was having a difficult time obtaining an Spo2 but once we did she was saturating only 57% on 6lpm nasal cannula. RT placed patient on a non rebreather mask with 100% O2 and patients saturation came up to 92%. She was also given her afternoon breathing treatment as well which also helped. Patient is stable now and does not appear to be in any distress. The patients RN was notified and is aware. Rt will continue to monitor.

## 2014-12-04 LAB — URINE CULTURE: CULTURE: NO GROWTH

## 2014-12-04 MED ORDER — ALBUTEROL SULFATE (2.5 MG/3ML) 0.083% IN NEBU
2.5000 mg | INHALATION_SOLUTION | Freq: Four times a day (QID) | RESPIRATORY_TRACT | Status: DC
  Filled 2014-12-04: qty 3

## 2014-12-04 MED ORDER — MORPHINE SULFATE (CONCENTRATE) 10 MG /0.5 ML PO SOLN: 10.0000 mg | ORAL | Status: AC | PRN

## 2014-12-04 NOTE — Discharge Summary (Addendum)
Physician Discharge Summary  Dana Hess ZOX:[id number] DOB: [birth year] DOA: 12/02/2014  PCP: Joycelyn RuaMEYERS, STEPHEN, MD  Admit date: 12/02/2014 Discharge date:   Time spent: 35 minutes  Recommendations for Outpatient Follow-up:  1. She will [place] placed with comfort measures.  Discharge Diagnoses:  Principal Problem:   Acute respiratory failure with hypoxia (HCC) Active Problems:   Acute encephalopathy   Leukocytosis   Severe protein-calorie malnutrition (HCC)   Discharge Condition: stable  Diet recommendation: comfort feeds  There were no vitals filed for this visit.  History of present illness:  81 year old female with past medical history of CoPD oxygen dependent and chronic steroid-dependent comes into the hospital for confusion.   Hospital Course:  Acute respiratory failure with hypoxia/COPD exacerbation:  Initially the family wanted the patient admitted and treated so she was started on IV steroids inhalers Rocephin and azithromycin. - Patient continued to struggle to breathe on a nonrebreather. Palliative Care was consulted and recommended to move towards comfort care. - The family agreed with this and initially they wanto to move toward comfort care. - We agreed to stop all overmedication and just use Roxanol for discomfort.  Acute encephalopathy: Leukocytosis AKI Hypernatremia:  Procedures:  CXR  Consultations:  PMT  Discharge Exam: Filed Vitals:    0516  BP:   Pulse: 100  Temp: 97.4 F (36.3 C)  Resp: 26    General: A&O x1 Cardiovascular: RRR Respiratory: moderate air movement.  Discharge Instructions   Discharge Instructions    Diet - low sodium heart healthy    Complete by:  As directed      Increase activity slowly    Complete by:  As directed           Current Discharge Medication List    START taking these medications   Details  Morphine Sulfate (MORPHINE CONCENTRATE) 10 mg / 0.5 ml concentrated solution  Take 0.5 mLs (10 mg total) by mouth every 4 (four) hours as needed for severe pain. Qty: 10 mL, Refills: 0      CONTINUE these medications which have NOT CHANGED   Details  ALPRAZolam (XANAX) 1 MG tablet Take 0.5-1 mg by mouth 3 (three) times daily as needed for anxiety. Take 0.5 tablet twice a day  Take 1 tablet at bedtime    albuterol (PROVENTIL HFA;VENTOLIN HFA) 108 (90 BASE) MCG/ACT inhaler Inhale 2 puffs into the lungs every 6 (six) hours as needed. For shortness of breath.      STOP taking these medications     aspirin EC 81 MG tablet      budesonide (PULMICORT) 0.25 MG/2ML nebulizer solution      butalbital-acetaminophen-caffeine (FIORICET, ESGIC) 50-325-40 MG per tablet      cholecalciferol (VITAMIN D) 1000 UNITS tablet      ferrous sulfate 325 (65 FE) MG tablet      furosemide (LASIX) 20 MG tablet      guaiFENesin (MUCINEX) 600 MG 12 hr tablet      ipratropium-albuterol (DUONEB) 0.5-2.5 (3) MG/3ML SOLN      KLOR-CON 8 MEQ tablet      pantoprazole (PROTONIX) 40 MG tablet      predniSONE (DELTASONE) 5 MG tablet      pseudoephedrine (SUDAFED) 30 MG tablet        Allergies  Allergen Reactions  . Adhesive [Tape] Other (See Comments)    Patient has thin skin from prednisone treatment  . Codeine Nausea And Vomiting and Rash      The results of  significant diagnostics from this hospitalization (including imaging, microbiology, ancillary and laboratory) are listed below for reference.    Significant Diagnostic Studies: Ct Head Wo Contrast  12/02/2014  CLINICAL DATA:  Altered mental status EXAM: CT HEAD WITHOUT CONTRAST TECHNIQUE: Contiguous axial images were obtained from the base of the skull through the vertex without intravenous contrast. COMPARISON:  04/05/2009 FINDINGS: Sinuses/Soft tissues: New right mastoid effusion. Other paranasal sinuses are clear. Intracranial: Cerebral volume loss is expected for age. Cerebellar volume loss as well. No mass lesion,  hemorrhage, hydrocephalus, acute infarct, intra-axial, or extra-axial fluid collection. IMPRESSION: 1.  No acute intracranial abnormality. 2. Right mastoid effusion. Electronically Signed   By: Jeronimo Greaves M.D.   On: 12/02/2014 15:31   Dg Chest Port 1 View  12/02/2014  CLINICAL DATA:  Altered mental status, hypoxia, weakness for 1 week. Cough for 4 days. COPD, emphysema. Prior smoker. EXAM: PORTABLE CHEST 1 VIEW COMPARISON:  05/25/2013 FINDINGS: There is hyperinflation of the lungs compatible with COPD. Heart is borderline in size. No confluent opacities or effusions. No acute bony abnormality. IMPRESSION: Stable COPD.  No active disease. Electronically Signed   By: Charlett Nose M.D.   On: 12/02/2014 15:01    Microbiology: Recent Results (from the past 240 hour(s))  Blood Culture (routine x 2)     Status: None (Preliminary result)   Collection Time: 12/02/14  2:50 PM  Result Value Ref Range Status   Specimen Description BLOOD LEFT HAND  Final   Special Requests IN PEDIATRIC BOTTLE 1 CC  Final   Culture   Final    NO GROWTH < 24 HOURS Performed at [facility]    Report Status PENDING  Incomplete  Blood Culture (routine x 2)     Status: None (Preliminary result)   Collection Time: 12/02/14  3:35 PM  Result Value Ref Range Status   Specimen Description BLOOD RIGHT ANTECUBITAL  Final   Special Requests BOTTLES DRAWN AEROBIC AND ANAEROBIC 5 CC EA  Final   Culture   Final    NO GROWTH < 24 HOURS Performed at [facility]    Report Status PENDING  Incomplete  Urine culture     Status: None   Collection Time: 12/02/14  5:30 PM  Result Value Ref Range Status   Specimen Description URINE, CATHETERIZED  Final   Special Requests NONE  Final   Culture   Final    NO GROWTH 2 DAYS Performed at [facility]    Report Status  FINAL  Final     Labs: Basic Metabolic Panel:  Recent Labs Lab 12/02/14 1449 12/03/14 0616  NA 145 146*  K 3.5 4.9  CL 86* 85*   CO2 47* >50*  GLUCOSE 111* 149*  BUN 38* 36*  CREATININE 0.94 0.86  CALCIUM 9.0 8.8*  MG 1.8  --    Liver Function Tests:  Recent Labs Lab 12/02/14 1449  AST 22  ALT 16  ALKPHOS 44  BILITOT 0.5  PROT 5.8*  ALBUMIN 3.5    Recent Labs Lab 12/02/14 1449  LIPASE 35   No results for input(s): AMMONIA in the last 168 hours. CBC:  Recent Labs Lab 12/02/14 1449 12/03/14 0616  WBC 13.1* 12.8*  NEUTROABS 11.2*  --   HGB 8.1* 8.2*  HCT 28.8* 30.6*  MCV 101.4* 105.5*  PLT 186 195   Cardiac Enzymes: No results for input(s): CKTOTAL, CKMB, CKMBINDEX, TROPONINI in the last 168 hours. BNP: BNP (last 3 results)  Recent Labs  12/02/14 1449  BNP 327.1*    ProBNP (last 3 results) No results for input(s): PROBNP in the last 8760 hours.  CBG: No results for input(s): GLUCAP in the last 168 hours.   Signed:  Marinda Elk  [facility] , 10:55 AM

## 2014-12-04 NOTE — Progress Notes (Signed)
[facility].  [facility] transporting patient.  Family at bedside.  Patient DNR signed by physician, MOST form in packet and family informed and understands to get Hospice to complete, foley catheter intact and patent, 6L [place].  Patient has no s/s of pain or distress.  No complaints by patient or family.

## 2014-12-04 NOTE — Progress Notes (Signed)
Utilization review completed.  

## 2014-12-04 NOTE — Progress Notes (Signed)
2:11pm. CSW called [facility] for transport for hospice. Discharge materials and EMS form on chart. Family, Dorene SorrowJerry, and nursing aware.  York SpanielAlexandra Doralee Kocak Brand [facility] IncCSWA Clinical Social Worker [facility] phone: [phone]

## 2014-12-04 NOTE — Care Management Note (Signed)
Case Management Note  Patient Details  Name: Dana Hess MRN: [id number] Date of Birth: [birth year]  Subjective/Objective:  Acute respiratory failure with hypoxia , End-stage COPD                  Action/Plan: Pt from home with [facility]. Plan is [facility] residential [facility]. Contacted CSW for referral to [facility].   Expected Discharge Date:                 Expected Discharge Plan:  Hospice Medical Facility  In-House Referral:  Clinical Social Work  Discharge planning Services  CM Consult   Status of Service:  Completed, signed off  Medicare Important Message Given:    Date Medicare IM Given:    Medicare IM give by:    Date Additional Medicare IM Given:    Additional Medicare Important Message give by:     If discussed at Long Length of Stay Meetings, dates discussed:    Additional Comments:  Dana Hess, Dana Memoli Ellen, RN , 1:05 PM

## 2014-12-04 NOTE — Progress Notes (Signed)
Patient [facility].  Given to Adrianeresa, [place].  [phone].  Patient will be going to Room 7.

## 2014-12-07 LAB — CULTURE, BLOOD (ROUTINE X 2)
Culture: NO GROWTH
Culture: NO GROWTH

## 2014-12-20 DEATH — deceased

## 2017-08-19 IMAGING — CT CT HEAD W/O CM
2 series · 16 of 30 positions shown, 20 images · non-contrast
Comparison: 04/05/2009

CLINICAL DATA: Altered mental status

EXAM:
CT HEAD WITHOUT CONTRAST
TECHNIQUE: Contiguous axial images were obtained from the base of the skull
through the vertex without intravenous contrast.

[Series 2: head w/o · axial · non-contrast · 0.45mm/px · z∈[-144,-9]mm · 13 of 33 slices shown, 17 images]
[im 3/33  brain]
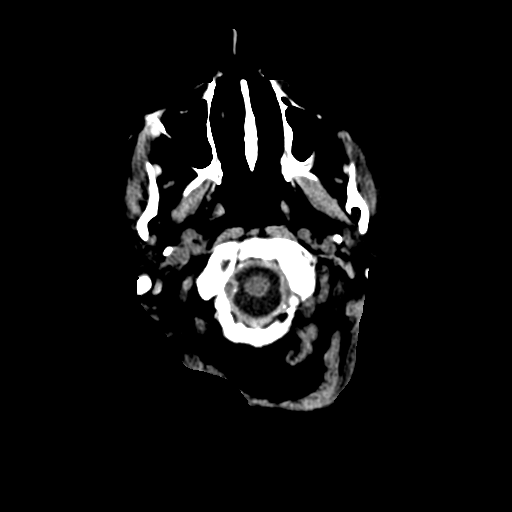
[im 3/33  bone]
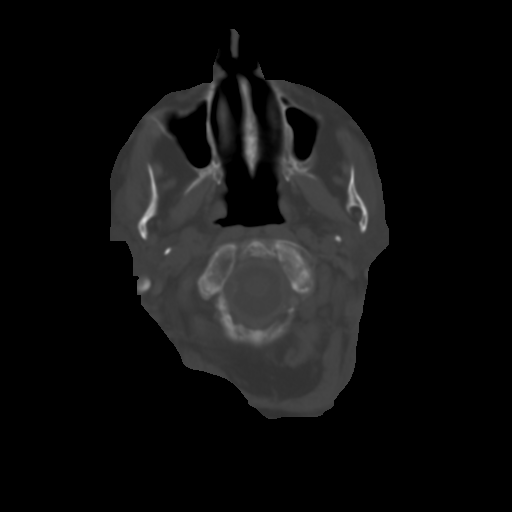
[im 5/33  brain]
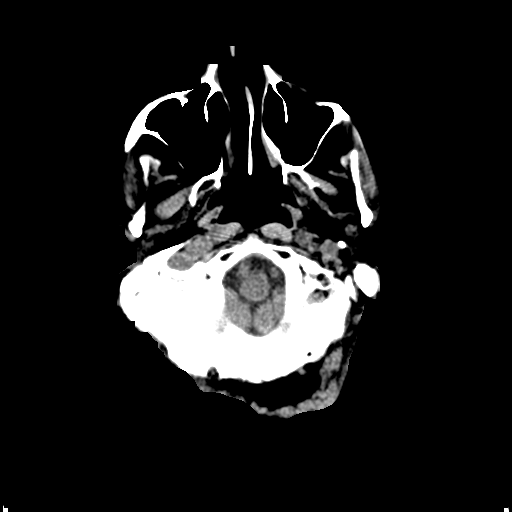
[im 7/33  brain]
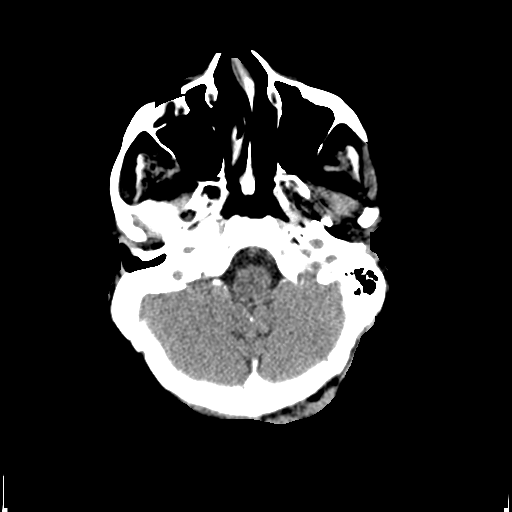
[im 10/33  brain]
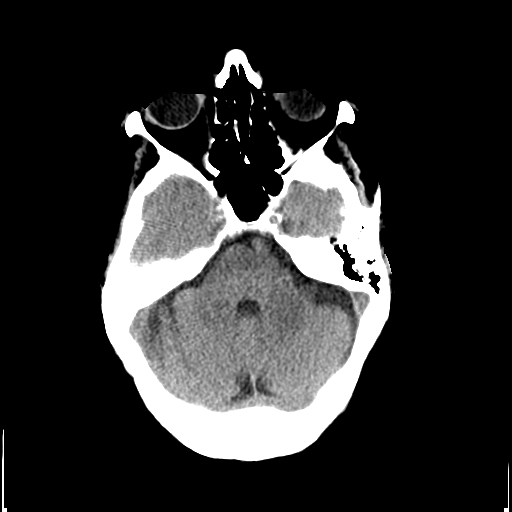
[im 12/33  brain]
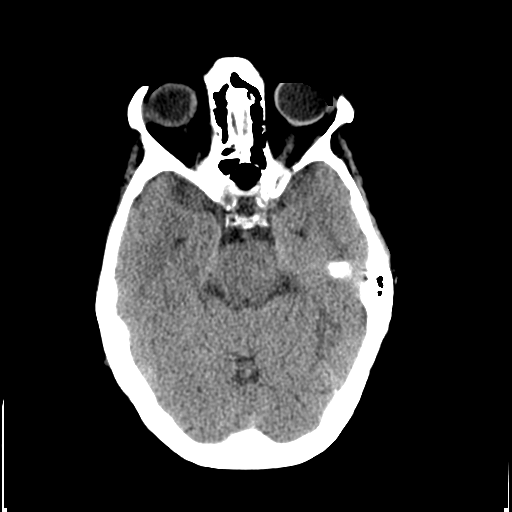
[im 12/33  bone]
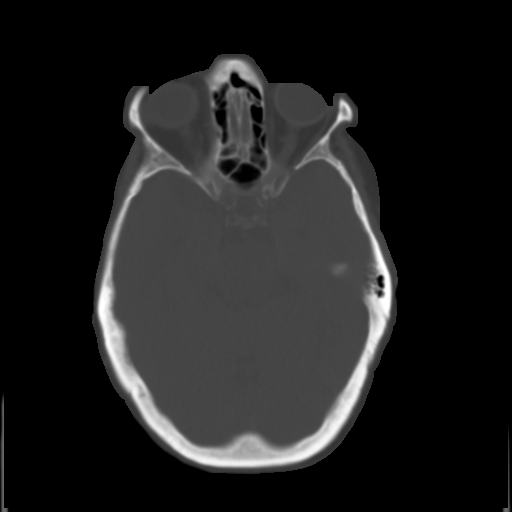
[im 14/33  brain]
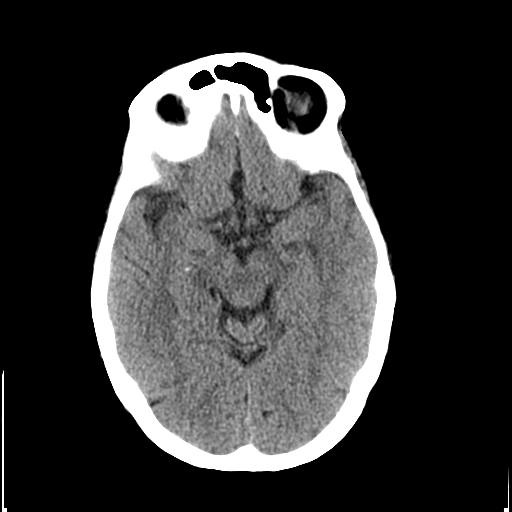
[im 17/33  brain]
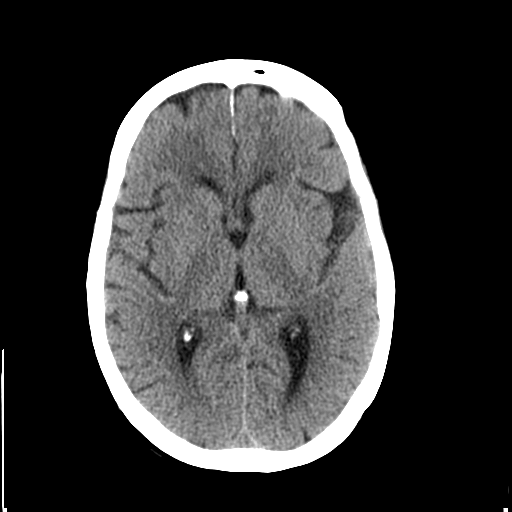
[im 19/33  brain]
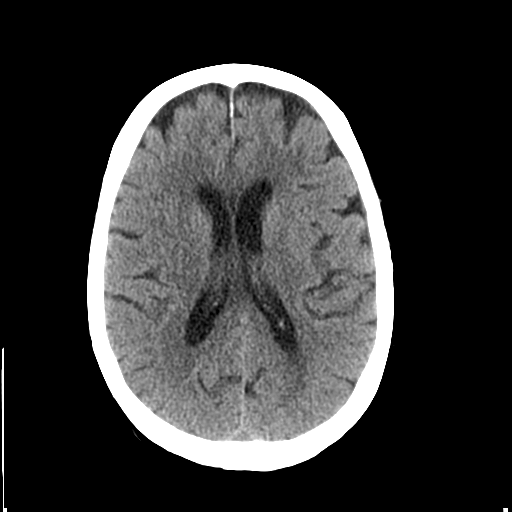
[im 21/33  brain]
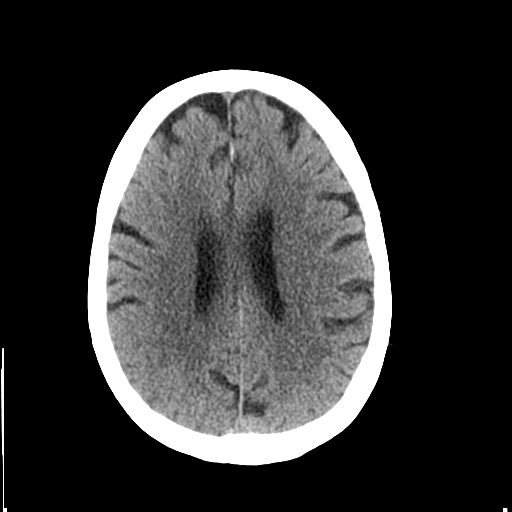
[im 21/33  bone]
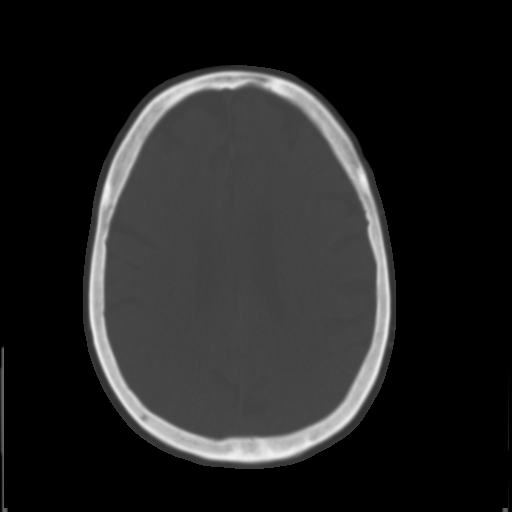
[im 23/33  brain]
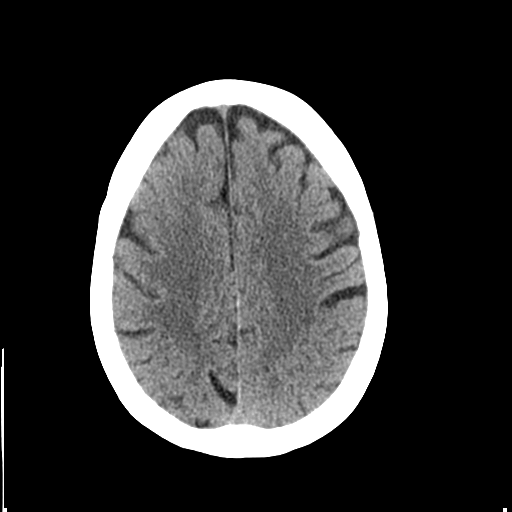
[im 26/33  brain]
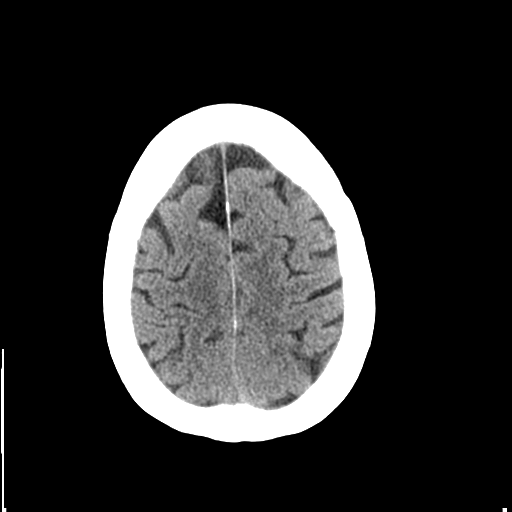
[im 28/33  brain]
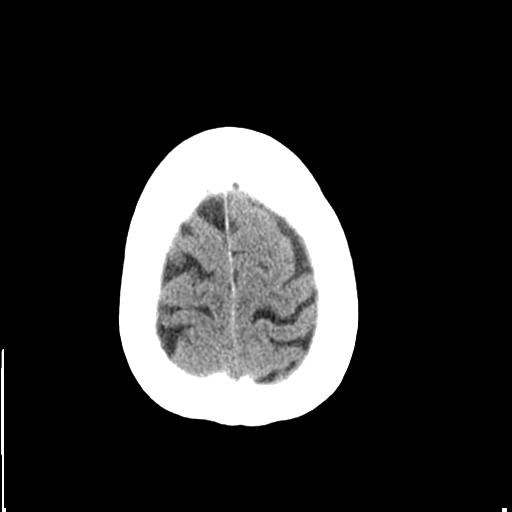
[im 30/33  brain]
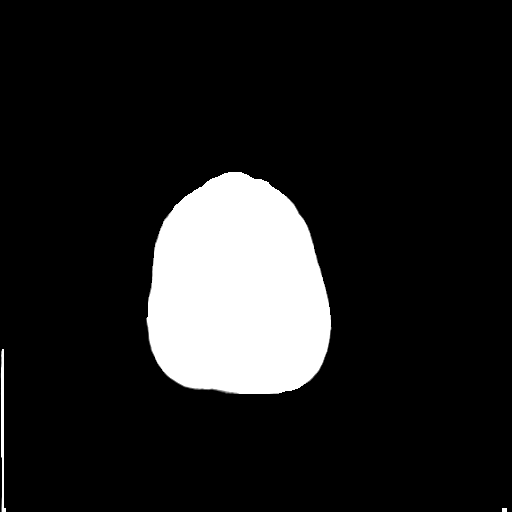
[im 30/33  bone]
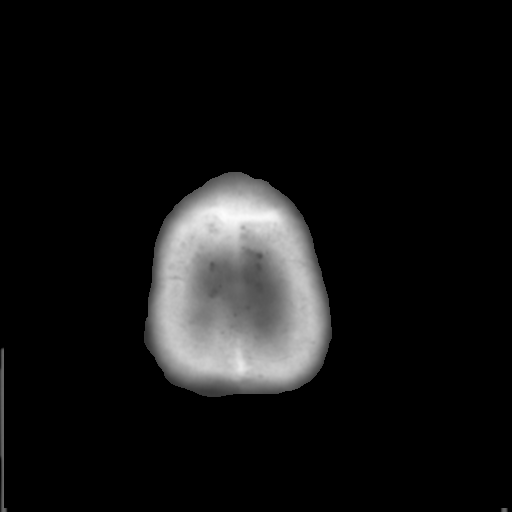

[Series 3: bone windows · axial · 0.45mm/px · z∈[-144,-99]mm · 3 of 33 slices shown]
[im 3/33  bone]
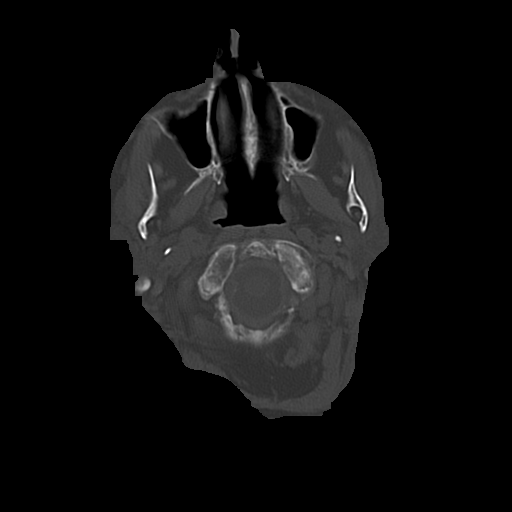
[im 7/33  bone]
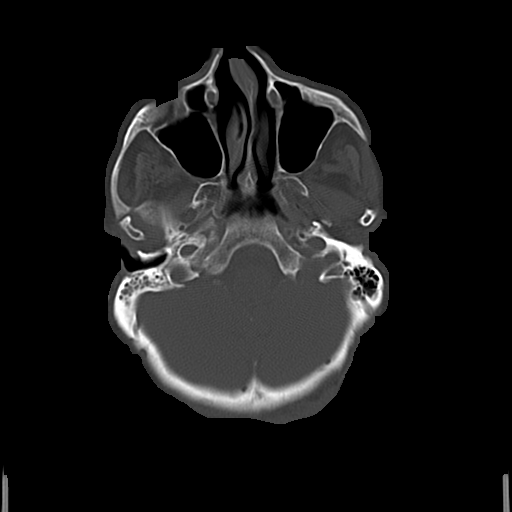
[im 12/33  bone]
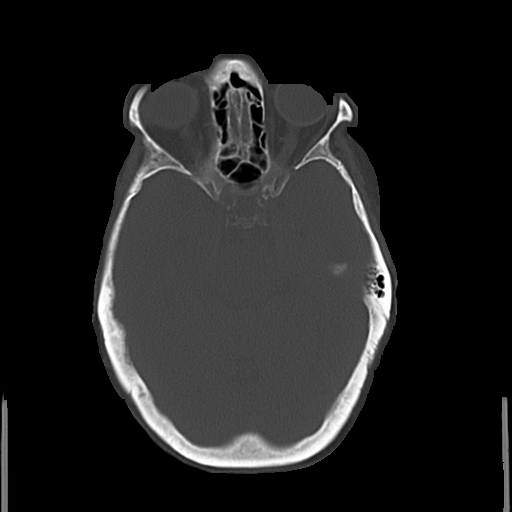

[16 of 30 positions shown; findings below may reference images not displayed]

FINDINGS: Sinuses/Soft tissues: New right mastoid effusion. Other paranasal
sinuses are clear.

Intracranial: Cerebral volume loss is expected for age. Cerebellar
volume loss as well. No mass lesion, hemorrhage, hydrocephalus,
acute infarct, intra-axial, or extra-axial fluid collection.
IMPRESSION: 1.  No acute intracranial abnormality.
2. Right mastoid effusion.
